# Patient Record
Sex: Female | Born: 1935 | Race: Black or African American | Hispanic: No | Smoking: Never smoker
Health system: Southern US, Community
[De-identification: ages and names within clinical notes are randomized; demographics above are authoritative.]

## PROBLEM LIST (undated history)

## (undated) DIAGNOSIS — F0390 Unspecified dementia without behavioral disturbance: Secondary | ICD-10-CM

## (undated) DIAGNOSIS — I749 Embolism and thrombosis of unspecified artery: Secondary | ICD-10-CM

## (undated) DIAGNOSIS — D649 Anemia, unspecified: Secondary | ICD-10-CM

## (undated) DIAGNOSIS — R4702 Dysphasia: Secondary | ICD-10-CM

## (undated) DIAGNOSIS — I8290 Acute embolism and thrombosis of unspecified vein: Secondary | ICD-10-CM

---

## 2006-12-11 ENCOUNTER — Encounter: Payer: Self-pay | Admitting: Cardiology

## 2006-12-11 ENCOUNTER — Ambulatory Visit: Payer: Self-pay | Admitting: Cardiology

## 2006-12-11 ENCOUNTER — Ambulatory Visit: Payer: Self-pay | Admitting: Internal Medicine

## 2006-12-11 ENCOUNTER — Inpatient Hospital Stay (HOSPITAL_COMMUNITY): Admission: EM | Admit: 2006-12-11 | Discharge: 2007-01-15 | Payer: Self-pay | Admitting: Cardiology

## 2007-08-28 ENCOUNTER — Inpatient Hospital Stay (HOSPITAL_COMMUNITY): Admission: EM | Admit: 2007-08-28 | Discharge: 2007-09-04 | Payer: Self-pay | Admitting: Emergency Medicine

## 2008-10-12 ENCOUNTER — Ambulatory Visit: Payer: Self-pay | Admitting: Vascular Surgery

## 2008-10-13 ENCOUNTER — Inpatient Hospital Stay (HOSPITAL_COMMUNITY): Admission: RE | Admit: 2008-10-13 | Discharge: 2008-10-17 | Payer: Self-pay | Admitting: Vascular Surgery

## 2008-10-13 ENCOUNTER — Encounter: Payer: Self-pay | Admitting: Vascular Surgery

## 2008-11-09 ENCOUNTER — Ambulatory Visit: Payer: Self-pay | Admitting: Vascular Surgery

## 2009-06-04 ENCOUNTER — Ambulatory Visit (HOSPITAL_COMMUNITY): Admission: RE | Admit: 2009-06-04 | Discharge: 2009-06-04 | Payer: Self-pay | Admitting: Internal Medicine

## 2010-02-10 ENCOUNTER — Encounter: Payer: Self-pay | Admitting: Internal Medicine

## 2010-04-26 LAB — CBC
HCT: 35.5 % — ABNORMAL LOW (ref 36.0–46.0)
Hemoglobin: 11.6 g/dL — ABNORMAL LOW (ref 12.0–15.0)
MCHC: 32.8 g/dL (ref 30.0–36.0)
RDW: 14.8 % (ref 11.5–15.5)

## 2010-04-26 LAB — BASIC METABOLIC PANEL
CO2: 22 mEq/L (ref 19–32)
Glucose, Bld: 167 mg/dL — ABNORMAL HIGH (ref 70–99)
Potassium: 3.9 mEq/L (ref 3.5–5.1)
Sodium: 147 mEq/L — ABNORMAL HIGH (ref 135–145)

## 2010-06-04 NOTE — Consult Note (Signed)
NAMEFREDDYE, CARDAMONE   ACCOUNT NO.:  [id number]   MEDICAL RECORD NO.:  [id number]          PATIENT TYPE:  INP   LOCATION:  1333                         FACILITY:  [facility]   PHYSICIAN:  Wilson Singer, M.D.DATE OF BIRTH:  [birth year]   DATE OF CONSULTATION:  09/01/2007  DATE OF DISCHARGE:                                 CONSULTATION   AGE:  77.   LOCATION:  1200.   REFERRING PHYSICIAN:  Nadear A. Arthor Captain, MD   DATE OF CONSULTATION:  August 31, 2007.   REASON FOR CONSULTATION:  To assist with goals of care and symptom  recommendations.   Consult performed by Arline Asp, nurse practitioner in  collaboration with Dr. Lilly Cove.   This NP reviewed the medical records, received report from the team,  assessed the patient, and then met with the patient's niece to discuss  goals, options, end-of-life wishes, etc.  Greater than 50% of this  consultation was spent in counseling and coordination of care from the  time of 11:30 a.m. to 1:20 p.m.   FAMILY WISHES/RECOMMENDATIONS:  1. DNR and comfort care.  2. No artificial feeds.  3. Transfer to the palliative care unit, where bed is available.  4. Comfort feeds understanding the risk of aspiration.  5. Back to the skilled nursing facility with palliative care versus      hospice to follow.   IMPRESSION:  Ongoing chronic failure to thrive in the phase of  progressive dementia, severe weight loss, dependent for all of the ADLs.  Acute diarrhea recently with colitis caused decreased intake and  increased water loss with resultant renal failure, which is now  resolving.  The patient also has a history of cyclical infections over  the last few months.  At the present time, the family goals are comfort  as they understand their aunt would not want artificial means of support  in the phase of declining condition and disease.  Disposition will be  dependent upon her intake.  The patient's niece is agreeable to a  skilled nursing facility as the patient came from [place] once the  patient is stable.  Support education reassurance given.  Please call  Arline Asp, nurse practitioner, at [phone] with any questions or  concerns.   [name] is an 77 year old African American female, who  came from [facility] with a past medical history  including COPD, dementia, coronary artery disease, dysphagia, and  malnutrition with failure to thrive and DNR status.  She was admitted to  the hospital on August 28, 2007 secondary to having increased diarrhea as  well as weakness and worsening creatinine on admission noted to be 6.5.  The patient was treated with fluid resuscitation and was seen by GI.  However, she was deemed to be a nonsurgical candidate.  I was asked to  meet with the patient's family today to discuss goals of care based on  her overall underlying dementia and failure to thrive.   PAST MEDICAL HISTORY:  As above in the HPI.   MEDICATIONS INCLUDE:  1. Aspirin 81 mg p.o. daily.  2. Depakote 125 mg p.o. daily.  3. Multivitamin 1 tablet p.o.  daily.  4. K-Dur 20 mEq p.o. daily.  5. Vitamin E 100 units p.o. daily.  6. Ventolin 2.5 mg inhaler q.6 h. p.r.n.  7. Robitussin 5 mL p.o. q.4 h. p.r.n.   ALLERGIES:  No known drug allergies.   SOCIAL HISTORY:  The family resides at [facility].  She has  a niece, who is named Biochemist, clinical, who is her Scientific laboratory technician and also  lives here in [place].  She is retired and was living at home until  several months ago when she was noted to have progressive dementia.  Since then, she has been declining per family, and family wishes that  she be a DNR.   FAMILY HISTORY:  Noncontributory.   REVIEW OF SYSTEMS:  The patient is unable to answer most questions.  However, she is able to answer yes/no.  She denies pain.  Denies  shortness of breath, refuses food, and appears comfortable per staff.  Staff do  report that she is refusing most food and pills.  They also  report that she is presently confused and agitated.  Her niece reports  that she has had progressive weight loss and functional decline over the  last few months with dependency for all ADLs.  They do report  significant direct diarrhea over the last few days.   PHYSICAL EXAMINATION:  GENERAL:  She is a frail, cachectic, African  American female, in no acute distress.  VITAL SIGNS:  Heart rate is 94, respirations 22, and O2 sat 98% on 2  liters.  HEENT:  Head is again with temporal wasting.  Mucous membranes dry.  She  has poor dentition.  NECK:  Supple without lymphadenopathy.  No thyromegaly.  No JVD.  HEART:  Heart rate is regular.  No murmurs, rubs, or gallops.  LUNGS:  Diminished; however, clear bilaterally.  ABDOMEN:  Soft.  Positive bowel sounds.  EXTREMITIES:  No edema with significant atrophy bilaterally.   LAB DATA:  Includes platelets 101, white blood cell count 12.5,  hemoglobin 9.1; sodium 149, BUN 23, creatinine 1.70, and calcium 7.5.      Asencion Noble, NP      ______________________________  Wilson Singer, M.D.    KMJ/MEDQ  D:  09/01/2007  T:  09/02/2007  Job:  [id number]   cc:   Michelene Gardener, MD   Hospice and Palliative Care, [facility]

## 2010-06-04 NOTE — H&P (Signed)
[name], [name] NO.:  [id number]   MEDICAL RECORD NO.:  [id number]          PATIENT TYPE:  EMS   LOCATION:  ED                           FACILITY:  [facility]   PHYSICIAN:  Herbie Saxon, MDDATE OF BIRTH:  Nov 15, 1922   DATE OF ADMISSION:  08/27/2007  DATE OF DISCHARGE:                              HISTORY & PHYSICAL   PRIMARY CARE PHYSICIAN:  Karlene Einstein, M.D.   This is a do not resuscitate, do not intubate.   PRESENTING COMPLAINT:  Loose bloody stools 3 days, weakness 3 days.   HISTORY OF PRESENTING COMPLAINT:  This 77 year old African American  female, nursing home resident at [facility], with severe dementia,  delirium, pericardial effusion historically, was transferred from the  nursing home because of weakness and rectal bleeding for the last 3  days.  The patient is unable to add any further history as she is  demented.  Currently agitated.   Past medical history and nursing home chart reviewed.  She has a history  of heavy alcohol use, and also the patient is a current tobacco smoker.  Past medical history also includes COPD, coronary artery disease,  dysphagia, malnutrition, hyperlipidemia.   SOCIAL HISTORY:  She lives in a nursing home.  Current smoker, heavy  drinker.   FAMILY HISTORY:  Not available.   ALLERGIES:  No known drug allergies.   MEDICATIONS:  1. Altace 2.5 daily.  2. Zocor 40 mg daily.  3. Aricept 10 mg daily.  4. Depakote 125 mg daily.  5. Multivitamins 1 tablet daily.  6. Lasix 20 mg daily.  7. Folbic 1 tablet daily.  8. Aspirin 325 mg daily.  9. Vitamin E 100 mg daily.  10.Potassium chloride 20 mEq daily.  11.Lopressor 12.5 mg b.i.d.   REVIEW OF SYSTEMS:  Not available.  The patient could not give history  owing to confusion.   PHYSICAL EXAMINATION:  GENERAL:  She is an elderly lady.  Restless,  demented.  VITAL SIGNS:  Temperature 98, pulse 75, respiratory rate 16, blood  pressure 103/61.  HEENT:   Blind, right eye.  Dehydrated.  Mucous membranes are dry.  Oropharynx and nasopharynx are clear.  NECK:  Supple.  CHEST:  Clinically clear.  HEART:  Heart sounds 1 and 2, regular rhythm.  ABDOMEN:  Scaphoid, soft, nontender.  No organomegaly palpable.  There  is a Foley catheter in place.  EXTREMITIES:  She is moving all limbs symmetrically.  Not obeying  commands appropriately.  Peripheral pulses present.  No pedal edema.  No  jaundice.  No erythema, edema or joint edema.   LABORATORY DATA:  The labs showed WBC 27, hematocrit 42, platelet count  177.  PT 24.7, INR 2.1, PTT 34.  She has a sodium of 153, potassium 6.8,  chloride 130, bicarbonate 13, BUN 140, creatinine 6.5, glucose 176.  Urinalysis:  WBCs 3 to 6, nitrite positive, leukocyte esterase moderate.   ASSESSMENT:  1. Lower gastrointestinal bleed.  2. Sepsis.  3. Urinary tract infection.  4. Acute renal failure with metabolic acidosis.  5. Hyperkalemia.  6. Hypernatremia.  7. Hyperchloremia.  8. dehydration.  9. Enterocolitis.  10.Coagulopathy, not on Coumadin.  11.Hyperglycemia, rule out new onset diabetes.  12.Leukocytosis.  13.Hypotension.   The patient is to be admitted to the intensive care unit.  Administer  vitamin K and FFP to correct the coagulopathy, type and cross-match 4  units of packed red blood cells and transfuse with 2 units packed red  blood cells if hemoglobin drops below 8.  , GI evaluation and renal  evaluation. Start on IV Cipro and IV fluids, after blood and urine  culture.  We will repeat CBC, BMP in the next 2 hours.  If repeat  greater than 5 mg,  insulin and sodium bicarb.  Hold p.o. meds until GI  evaluates.  We will obtain a renal ultrasound scan, urine sodium level,  check stool WBC, ova and parasites, TB tox screen, H and H, urinalysis  24 hours.  We will check PT/INR daily for the next 3 days.  Check for  coagulation parameters hematology evaluation.  Start normal saline at  125 mL an  hour for the next 24 hours , will be on bed rest strictly,  seizure, fall precaution.  We will seek a palliative care consult after  discussing with family member.  We will get cardiac enzymes q.8h. x3 and  repeat q.8h. x3.      Herbie Saxon, MD  Electronically Signed     MIO/MEDQ  D:  08/28/2007  T:  08/28/2007  Job:  [id number]   cc:   Karlene Einstein, M.D.  Fax: [phone]

## 2010-06-04 NOTE — Assessment & Plan Note (Signed)
OFFICE VISIT   [name], [name]  DOB:  1922/08/03                                       11/09/2008  CHART#:[id number]   I saw the patient in the office today for followup after her recent  right above the knee amputation.  This is an 77 year old markedly  debilitated patient with severe dementia who presented with gangrenous  changes of her right foot.  She had multilevel arterial occlusive  disease and was not a candidate for revascularization.  She also had  severe protein malnutrition.   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  The AKA has healed nicely and  her staples were removed in the office today.  The left foot is warm and  well-perfused with no ischemic ulcers and no heel sore.  I plan on  seeing her back in 6 months.  She knows to call sooner if she has  problems.   Di Kindle. Edilia Bo, M.D.  Electronically Signed   CSD/MEDQ  D:  11/09/2008  T:  11/10/2008  Job:  [id number]

## 2010-06-04 NOTE — Discharge Summary (Signed)
NAMEBERDINA, CHEEVER   ACCOUNT NO.:  [id number]   MEDICAL RECORD NO.:  [id number]          PATIENT TYPE:  INP   LOCATION:  3022                         FACILITY:  [facility]   PHYSICIAN:  Wilson Singer, M.D.DATE OF BIRTH:  Mar 22, 1922   DATE OF ADMISSION:  12/11/2006  DATE OF DISCHARGE:                               DISCHARGE SUMMARY   DATE OF DISCHARGE:  To be determined.   CURRENT DIAGNOSES:  1. Moderate to advanced dementia.  2. Non-ST elevation myocardial infarction versus Tako-Tsubo syndrome,      clinically stable.  3. Delirium, resolved.  4. Chronic obstructive pulmonary disease, stable.  5. Protein calorie malnutrition.  6. Dysphagia on a dysphagia-I diet.  7. Moderate echo free pericardial effusion without any cardiac      tamponade.   CONDITION AT PRESENT:  Stable.   PRESENT MEDICATIONS:  1. Altace 2.5 mg daily.  2. Zocor 40 mg daily.  3. Thiamine 100 mg daily.  4. Nicotine patch 21 mg daily.  5. Multivitamin one tablet daily.  6. Lasix 20 mg daily.  7. Foltx 1 tablet daily.  8. Aricept 5 mg q.h.s.  9. Depakote 250 mg daily.  10.Potassium chloride 20 mEq daily.  11.Aspirin 325 mg daily.  12.Colace 100 mg daily.  13.Protonix 40 mg daily.  14.Metoprolol 12.5 mg b.i.d.   HISTORY:  This 77 year old lady was admitted on December 11, 2006, where  she was noted to be confused from the behavioral health.  Please see  initial history and physical examination dictated by Dr. Hillery Aldo.  Please also see interim discharge summary by Dr. Corky Downs.  Please also  see consultations dictated by Dr. Antonietta Breach, Dr. Ellison Carwin  - neurology, and Dr. Valera Castle - cardiology.   HOSPITAL PROGRESS:  Since the interim discharge summary of Dr. Jamison Oka, there has really not been any significant change in the  patient's condition.  She remains slightly agitated due to her dementia.  She has no  other complaint such as shortness of breath or chest  pain.  She has been  seen by cardiology who have signed off and also by neurology.  She is on  a dysphagia-I diet, and she seems to be tolerating this.  She is now  awaiting placement to a skilled nursing facility pending Medicare issues  with the confusion of lack of Medicare number.      Wilson Singer, M.D.  Electronically Signed     NCG/MEDQ  D:  12/31/2006  T:  12/31/2006  Job:  [id number]

## 2010-06-04 NOTE — Procedures (Signed)
EEG NUMBER:  [id number].   HISTORY:  This is an 77 year old patient with a history of alcohol abuse  and confusion over the last month.  The patient is being evaluated for  the confusion.  This is a portable EEG recording.  No skull defects are  noted.   MEDICATIONS:  1. Aspirin.  2. Altace.  3. Folic acid.  4. Protonix.  5. Zocor.  6. Lopressor.  7. Ativan.   EEG classification essentially normal awake and drowsy.   DESCRIPTION OF THE RECORDING:  Background rhythm of this recording  consists of a moderately well-modulated medium amplitude alpha rhythm of  8 Hz that is reactive.  As the record progresses, the patient appears to  be a somewhat drowsy state with 7 Hz to 8 Hz of background slowing at  times with some rudimentary sleep spindles seen off and on.  The patient  does also demonstrate some generalized beta frequency activity  intermittently during the recording.  Photic stimulation is performed  resulting in a minimal bilateral photic driving response.  Hyperventilation was not done.  Towards the end of the recording, the  patient was alerted with 8 Hz of background activity seen and some head  movement and muscle movement artifact noted.  At no time during the  recording does there appear to be evidence of spike wave discharges or  evidence of focal slowing.  EKG monitor shows no evidence of cardiac  rhythm abnormalities with a heart rate of 90.   IMPRESSION:  This is an essentially normal EEG recording in the awake  and drowsy state.  No evidence of ictal or interictal discharges are  seen.  Overlying beta activity may be a medication effect such as is  seen with benzodiazepine such as Ativan.      Marlan Palau, M.D.  Electronically Signed     ZOX:WRUE  D:  12/14/2006 19:01:15  T:  12/15/2006 09:59:02  Job #:  [id number]

## 2010-06-04 NOTE — Op Note (Signed)
NAMEAVANI, SENSABAUGH NO.:  [id number]   MEDICAL RECORD NO.:  [id number]          PATIENT TYPE:  EMS   LOCATION:  ED                           FACILITY:  [facility]   PHYSICIAN:  Hillery Aldo, M.D.   DATE OF BIRTH:  09-09-1922   DATE OF PROCEDURE:  12/11/2006  DATE OF DISCHARGE:                               OPERATIVE REPORT   PRIMARY CARE PHYSICIAN:  None.   CHIEF COMPLAINT:  Altered mental status.   HISTORY OF PRESENT ILLNESS:  The patient is an 77 year old female who  was brought to the [facility] for an involuntary commitment  requested by the patient's niece.  The patient's niece is [name],  and she can be reached at [phone].  According to the patient's niece,  the patient was recently evicted from her resident secondary to  nonpayment of rent.  She has been delusional, agitated, violent and  exhibiting self-neglect.  She has had transients coming and going from  the house who are identified by the local police as drug users.  Upon  evaluation at the [facility], she was sent to the emergency  department for medical clearance and a routine EKG and laboratory  testing showed significant abnormalities.  We are asked to see her in  consultation by the ED physician.   PAST MEDICAL HISTORY:  Chronic obstructive pulmonary disease by  radiograph.  The patient and the patient's niece denies any ongoing  medical problems or prior history of surgery.   FAMILY HISTORY:  The patient states that her parents died of old age.  The patient's niece cannot provide any additional history.  She does  have a brother who died of cancer of the prostate gland as well as lung  cancer.   SOCIAL HISTORY:  The patient is widowed.  She currently lives alone but,  again, has been recently evicted from her apartment.  She has had  transients staying with her.  She is a heavy tobacco user and heavy  alcohol abuse there but does not specify the quantity.  She denies any  drug use.   ALLERGIES:  No known drug allergies.   MEDICATIONS:  None with the exception of an occasional aspirin.   REVIEW OF SYSTEMS:  The patient denies any fever or chills.  She denies  problems with her appetite.  She denies any weight loss or weight gain.  She denies any chest pain, shortness of breath, cough, changes in her  bowel habits, melena or hematochezia.  She has occasional indigestion.  Denies dysuria.   PHYSICAL EXAMINATION:  VITAL SIGNS:  Temperature 97.4, pulse 87,  respirations 20, blood pressure 153/67.  GENERAL:  This is a thin elderly female who is in no acute distress.  HEENT:  Normocephalic, atraumatic.  The left pupil is round and reactive  to light.  The patient will not open her right eye and she appears to  have some abnormality there.  She states that she uses eye drops but  cannot specify what she uses them for or type of eye drops she uses.  Oropharynx reveals poor dentition.  NECK:  Supple, no thyromegaly, lymphadenopathy, no jugular venous  distention.  CHEST:  Decreased breath sounds bilaterally.  HEART:  Regular rate, rhythm.  No murmurs, rubs or gallops.  ABDOMEN:  Soft, nontender, nondistended with normoactive bowel sounds.  EXTREMITIES:  No clubbing, edema, cyanosis.  SKIN:  Warm and dry.  No rashes.  NEUROLOGIC:  The patient is alert and oriented to month, day of the week  and year.  She moves all extremities x4 with equal strength.  Nonfocal  exam.   DATA REVIEW:  Chest x-ray shows cardiac enlargement, changes of COPD  been negative for acute disease.   A 12-lead EKG shows sinus rhythm with premature atrial contractions.  There are T-wave inversions in leads II, III, aVF, and V4-V6.   LABORATORY DATA:  Sodium is 144, potassium 2.7, chloride 103, bicarb 28,  BUN 90, creatinine 0.61, glucose 119.  White blood cell count of 7.7,  hemoglobin 13, hematocrit 37.8, platelets 243.  Urine drug screen is  negative.  Alcohol level was 8.   Myoglobin and CK-MBs are normal but  troponins are elevated at above 3 x3 sets.  Urinalysis shows too  numerous to count white blood cells, 7-10 red blood cells, and many  bacteria.  There is positive nitrites and a small amount of leukocyte  esterase.   ASSESSMENT AND PLAN:  1. Delirium:  Multifactorial, but suspect an acute cardiac event may      be contributory.  She will need a cardiology evaluation.  The      emergency department physician has already contacted cardiology and      they wish to transfer her to [facility] and      admit her.  Additionally, her urinary tract infection may be      contributing.  She has received a dose of Rocephin in the emergency      department and I would continue this for three days minimum.  If      further medical consultation is needed, please call and we will be      happy to see the patient in consultation with the cardiologist.      Hillery Aldo, M.D.  Electronically Signed     CR/MEDQ  D:  12/11/2006  T:  12/11/2006  Job:  [id number]
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
## 2010-06-04 NOTE — Consult Note (Signed)
[name], [name]   ACCOUNT NO.:  [id number]   MEDICAL RECORD NO.:  [id number]          PATIENT TYPE:  INP   LOCATION:  1326                         FACILITY:  [facility]   PHYSICIAN:  Wilson Singer, M.D.DATE OF BIRTH:  Aug 05, 1922   DATE OF CONSULTATION:  DATE OF DISCHARGE:  09/04/2007                                 CONSULTATION   AGE:  77.   LOCATION:  1200.   REFERRING PHYSICIAN:  Nadear A. Arthor Captain, MD   DATE OF CONSULTATION:  August 31, 2007.   REASON FOR CONSULTATION:  To assist with goals of care and symptom  recommendations.   Consult performed by Arline Asp, nurse practitioner in  collaboration with Dr. Lilly Cove.   This NP reviewed the medical records, received report from the team,  assessed the patient, and then met with the patient's niece to discuss  goals, options, end-of-life wishes, etc.  Greater than 50% of this  consultation was spent in counseling and coordination of care from the  time of 11:30 a.m. to 1:20 p.m.   FAMILY WISHES/RECOMMENDATIONS:  1. DNR and comfort care.  2. No artificial feeds.  3. Transfer to the palliative care unit, where bed is available.  4. Comfort feeds understanding the risk of aspiration.  5. Back to the skilled nursing facility with palliative care versus      hospice to follow.   IMPRESSION:  Ongoing chronic failure to thrive in the phase of  progressive dementia, severe weight loss, dependent for all of the ADLs.  Acute diarrhea recently with colitis caused decreased intake and  increased water loss with resultant renal failure, which is now  resolving.  The patient also has a history of cyclical infections over  the last few months.  At the present time, the family goals are comfort  as they understand their aunt would not want artificial means of support  in the phase of declining condition and disease.  Disposition will be  dependent upon her intake.  The patient's niece is agreeable to a  skilled nursing facility as the patient came from [place] once the  patient is stable.  Support education reassurance given.  Please call  Arline Asp, nurse practitioner, at [phone] with any questions or  concerns.   Ms. [name] is an 77 year old African American female, who  came from [facility] with a past medical history  including COPD, dementia, coronary artery disease, dysphagia, and  malnutrition with failure to thrive and DNR status.  She was admitted to  the hospital on August 28, 2007 secondary to having increased diarrhea as  well as weakness and worsening creatinine on admission noted to be 6.5.  The patient was treated with fluid resuscitation and was seen by GI.  However, she was deemed to be a nonsurgical candidate.  I was asked to  meet with the patient's family today to discuss goals of care based on  her overall underlying dementia and failure to thrive.   PAST MEDICAL HISTORY:  As above in the HPI.   MEDICATIONS INCLUDE:  1. Aspirin 81 mg p.o. daily.  2. Depakote 125 mg p.o. daily.  3. Multivitamin 1 tablet p.o.  daily.  4. K-Dur 20 mEq p.o. daily.  5. Vitamin E 100 units p.o. daily.  6. Ventolin 2.5 mg inhaler q.6 h. p.r.n.  7. Robitussin 5 mL p.o. q.4 h. p.r.n.   ALLERGIES:  No known drug allergies.   SOCIAL HISTORY:  The family resides at [facility].  She has  a niece, who is named Biochemist, clinical, who is her Scientific laboratory technician and also  lives here in [place].  She is retired and was living at home until  several months ago when she was noted to have progressive dementia.  Since then, she has been declining per family, and family wishes that  she be a DNR.   FAMILY HISTORY:  Noncontributory.   REVIEW OF SYSTEMS:  The patient is unable to answer most questions.  However, she is able to answer yes/no.  She denies pain.  Denies  shortness of breath, refuses food, and appears comfortable per staff.  Staff do  report that she is refusing most food and pills.  They also  report that she is presently confused and agitated.  Her niece reports  that she has had progressive weight loss and functional decline over the  last few months with dependency for all ADLs.  They do report  significant direct diarrhea over the last few days.   PHYSICAL EXAMINATION:  GENERAL:  She is a frail, cachectic, African  American female, in no acute distress.  VITAL SIGNS:  Heart rate is 94, respirations 22, and O2 sat 98% on 2  liters.  HEENT:  Head is again with temporal wasting.  Mucous membranes dry.  She  has poor dentition.  NECK:  Supple without lymphadenopathy.  No thyromegaly.  No JVD.  HEART:  Heart rate is regular.  No murmurs, rubs, or gallops.  LUNGS:  Diminished; however, clear bilaterally.  ABDOMEN:  Soft.  Positive bowel sounds.  EXTREMITIES:  No edema with significant atrophy bilaterally.   LAB DATA:  Includes platelets 101, white blood cell count 12.5,  hemoglobin 9.1; sodium 149, BUN 23, creatinine 1.70, and calcium 7.5.      Asencion Noble, NP      Wilson Singer, M.D.  Electronically Signed   KMJ/MEDQ  D:  09/01/2007  T:  11/01/2007  Job:  [id number]
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
## 2010-06-04 NOTE — Discharge Summary (Signed)
[name], STEGMANN   ACCOUNT NO.:  [id number]   MEDICAL RECORD NO.:  [id number]          PATIENT TYPE:  INP   LOCATION:  2004                         FACILITY:  [facility]   PHYSICIAN:  Kayla I Elsaid, MD      DATE OF BIRTH:  04-11-1922   DATE OF ADMISSION:  12/11/2006  DATE OF DISCHARGE:  12/27/2006                               DISCHARGE SUMMARY   DISCHARGE MEDICATIONS:  1. Aspirin 325 mg p.o. daily.  2. Depakote 250 mg p.o. daily.  3. Aricept 10 mg p.o. nightly.  4. Lasix 20 mg p.o. daily.  5. Metoprolol 12.5 mg p.o. q.12 h.  6. Multivitamin with iron 1 tab p.o. daily.  7. Nicotine patch 21 mcg transdermal daily for 3 weeks.  8. Protonix 40 mg p.o. daily.  9. Potassium chloride 20 mEq p.o. daily.  10.Altace 5 mg p.o. daily.  11.Zocor 40 mg p.o. nightly.  12.Thiamine 100 mg p.o. daily.  13.Foltx 1 tab p.o. daily.  14.Docusate 100 mg p.o. daily.   PROCEDURES:  Modified barium swallow during hospitalization.   HOSPITAL COURSE:  Patient had difficulty with swallowing.  Swallow and  speech evaluate the patient.  They recommended to keep the patient  n.p.o.  A repeat modified barium swallow in 3-5 days.  Multiple trials  to place band tube.  Patient removed it every time it was placed.  Accordingly the patient was started on IV fluids.  The patient had  another modified barium swallow done by swallow and speech where their  final recommendation was D1  with pudding.  Thick liquid and patient  will continue to have risk of aspiration even with the above diet based  on the result of the modified barium swallow and baseline Coumadin  change.  Also they recommend small bites and sips, a swallow checkup  with each bite and sip and clear throat intermittently.  I had a  detailed discussion with the niece which is the next of kin regarding  the above results and niece agrees to continue D1 diet.  Also the risk  of respiration is high.  Accordingly patient was started on the  above  diet.  Regarding patient's altered mental status, the patient remained  alert with fluctuations and confusion.  __________ was that Dr.  Jeanie Harmon  evaluate the patient for the agitation where he recommended  Depakote to be started at 250 mg p.o. daily.  Patient remained stable  during hospitalization.  The plan was for this patient to return to the  nursing home when bed available.      Kayla Bosie Helper, MD  Electronically Signed     HIE/MEDQ  D:  12/27/2006  T:  12/27/2006  Job:  [id number]
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
## 2010-06-04 NOTE — Discharge Summary (Signed)
[name], [name]   ACCOUNT NO.:  [id number]   MEDICAL RECORD NO.:  [id number]           PATIENT TYPE:   LOCATION:                                 FACILITY:   PHYSICIAN:  Mobolaji B. Bakare, M.D.DATE OF BIRTH:  1922-06-07   DATE OF ADMISSION:  DATE OF DISCHARGE:                               DISCHARGE SUMMARY   PRIMARY CARE PHYSICIAN:  Unassigned.   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  2. Acute systolic congestive heart failure  3. Delirium.  4. Alcohol abuse.  5. Tobacco abuse.  6. Dementia with agitation.  7. Severe protein calorie malnutrition.  8. Moderate oropharyngeal dysphagia.  9. Chronic obstructive pulmonary disease.  10.Moderate echo-free pericardial effusion.   RECOMMENDATION:  Follow-up 2-D echocardiogram in 4-6 weeks.   PROCEDURES:  1. Head CT scan done on December 11, 2006 showed no acute intracranial      abnormality.  2. Chest x-ray showed cardiomegaly with mild pulmonary vascular      congestion and COPD.  3. Ultrasound of the kidneys done on December 11, 2006 showed no      hydronephrosis, slightly echogenic kidneys.  4. MRI of the brain without and with contrast done on December 14, 2006 showed no acute infarct, age-related atrophy without      hydrocephalus, small vessel disease type changes.  5. Follow-up chest x-ray on December 13, 2006 showed worsening      pulmonary opacities, likely edema, bilateral pleural effusion and      cardiomegaly.  6. Panda tube insertion.  7. Swallowing evaluation on December 16, 2006.  8. Chest x-ray done on December 19, 2006 showed stable cardiomegaly      without edema, residual left lower lobe atelectasis/consolidation      and associated infusion.  9. Swallowing evaluation done on December 18, 2006.  10.A 2-D echocardiogram done on December 11, 2006 showed akinesis of      distal half of left ventricular apex, this raises the possibility   of Tako-Tsubo (apical ballooning syndrome), ejection fraction was      30-35%.  There was aortic valve thickness mildly to moderately      increased, mild aortic valve stenosis, with gradient of 7 mmHg,      valve area of 1.37 sq cm by VTI and 1.25 sq cm by Vmax, moderate      echo-free pericardial effusion circumferential to the __________      dimension of 8 mm anterior and 8 mm posterior.   CONSULTS:  1. Cardiology consult provided by Dr. Dietrich Pates.  2. Psychiatric consult provided by Dr. Jeanie Sewer.  3. Neurology consult provided by Dr. Sharene Skeans.   BRIEF HISTORY:  [name] is an 77 year old African-American female  who was admitted on December 11, 2026.  The patient was brought to the  emergency room from a mental health facility where she was noted to be  confused.  Apparently the patient is widowed.  She lives by herself.  She drinks alcohol.  She smokes cigarettes.  She has neglected herself.  The niece tells me that the patient has started wandering and getting  lost  in the last 1 week and she obtained commitment paper and she was  about to be committed involuntarily to a behavioral health facility when  they noticed confusion and the patient was sent to the emergency room  for medical care.  Initial evaluation in the emergency room showed  abnormal EKG with T-wave abnormalities and elevated troponin of 3.22, CK-  MB of 7.3, myoglobin of 104.  She was clearly dehydrated with indices  with specific gravity showed 1.032.  Urinalysis was positive  for  leukocyte esterase and many bacteria.  The patient was initially  admitted by cardiology and we consulted.  Subsequently the patient was  transferred to our service ([facility]).   She was evaluated with a head CT scan which was unremarkable in the  emergency room.  She was started on beta blocker, ACE inhibitor, and  Ativan withdrawal protocol.  She was admitted onto cardiac floor.  She  was also started on aspirin and  Rocephin for probable UTI.   1. Confusion/delirium:  The patient was confused, not oriented to      place and time at initial admission.  This was felt to be      multifactorial including alcohol abuse and infection.  Workup      included urine drug screen which was negative, TSH which was normal      at 3.235, ABG was unremarkable, pH of 7.47, pCO2 38, pO2 77 on room      air.  Vitamin B12 was 220.  Methylmalonic acid level is pending      (vitamin B12 is low normal).  RPR was nonreactive.  Ammonia was      slightly elevated at 36.  A follow-up ammonia was normal, 21.  The      patient had normal liver enzymes with AST 20 and ALT of 9.  It      should be mentioned that the patient's magnesium was 2.3 and      homocysteine level was slightly elevated at 16.9.  She was started      on foltx.  Red blood cell folate was 357.  The patients mental      status did not improve over the course of the next 24-48 hours.      Neurology was consulted.  Dr. Sharene Skeans was concerned about      meningismus on physical examination.  However, she does not fit the      picture of meningitis.  A spinal tap was done.  This was      essentially normal with negative cryptococcal antigen, CSF VDRL      negative, cell count within normal, glucose was normal, but protein      slightly elevated at 46.  Culture no growth to date.  She had an      EEG which was essentially normal in the awake and drowsy state.      This was done 4 days after admission.  There was no evidence of      seizure activity.  Overall, the etiology of the delirium is felt to      be multifactorial secondary to Tako-Tsubo syndrome, probable non-ST-      elevation MI, and alcohol related.  It should be noted that urine      culture came back as no growth.  Hence, antibiotic was      discontinued.  At this point delirium is resolving.  Upon further      review of information from the  patient's niece, she has been      exhibiting dementia  symptoms.  She is now unable to take care of      herself at home.  She has been wandering.  During the course of      hospitalization, the patient has been agitated and combative.  She      was seen in consultation by Dr. Jeanie Sewer.  He suggested Depakote      sprinkles.  This has been started.  Reversible causes of dementia      were evaluated.  It is noted that she has a low normal vitamin B12      at 220, methylmalonic acid level is pending.  If these suggest B12      deficiency, would start the patient on vitamin B12 supplement.  She      has been started on Aricept 5 mg daily.  2. 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      Echocardiogram showed ejection fraction of 30%.  The patient has      been started on beta blocker, ACE inhibitor, aspirin.  She is      tolerating all these medications.  There is no planned intervention      at this point, given the patient's other comorbidities.  Even if it      is non-ST elevation MI, the patient is not deemed to be a candidate      for intervention.  She will be medically treated.  She was noted on      initial chest x-ray to have pulmonary vascular congestion and CHF.      She was diuresed periodically as her blood pressure could tolerate.      Chest x-ray at the time of discharge showed no pulmonary edema.      However, BNP is elevated at 1208.  The patient has been placed on      low dose Lasix 20 mg daily.  The patient had a prolonged QTc      interval noted on EKG with QTc interval of 525.  Haldol was      discontinued due to this.  She has been on telemetry and there has      not been any malignant arrhythmia on tele.  QTc interval at the      time of admission was 531.  The patient has a moderate echo-free      pericardial effusion on 2-D echo.  This will require follow-up in  4-      6 weeks to ensure stability.  3. Severe oropharyngeal dysphagia:  During the initial course of      hospitalization while the patient was severely confused, she was      unable to take p.o. A feeding tube was placed.  When she became      more alert, she had a second speech evaluation which indicated      moderate oropharyngeal dysphagia.  A dysphagia-2 honey liquid was      recommended.  The patient is currently tolerating this.  4. Severe protein calorie malnutrition:  The patient is noted to have      low albumin and prealbumin was also low at 5.9.  The patient was      seen by nutritionist during the course of hospitalization.  5.  Alcohol abuse:  She was started on CIWA protocol.  She completed      this treatment protocol.  She is on thiamine, multivitamin, folic      acid.  6. Tobacco abuse:  The patient has significant history of tobacco use.      She was started on nicotine patch.  She is unable to understand and      comply with tobacco cessation counseling.   DISPOSITION:  This will likely be to skilled nursing facility for safety  purposes and 24-hour dependent care, particularly with medication use.  The patient's closest relative is a niece, Ms. Karma Ganja, telephone  number [phone].   Addendum will be made to this dictation at the time of discharge with  medications and discharge laboratory data.      Mobolaji B. Corky Downs, M.D.  Electronically Signed     MBB/MEDQ  D:  12/19/2006  T:  12/19/2006  Job:  [id number]

## 2010-06-04 NOTE — Discharge Summary (Signed)
NAMELURETHA, [name] NO.:  [id number]   MEDICAL RECORD NO.:  [id number]          PATIENT TYPE:  INP   LOCATION:  1326                         FACILITY:  [facility]   PHYSICIAN:  Isidor Holts, M.D.  DATE OF BIRTH:  May 13, 1922   DATE OF ADMISSION:  08/27/2007  DATE OF DISCHARGE:  09/04/2007                               DISCHARGE SUMMARY   PRIMARY MEDICAL DOCTOR:  Dr. Karlene Einstein.   DISCHARGE DIAGNOSES:  1. Acute colitis, likely ischemic in etiology.  2. Lower gastrointestinal bleed, secondary to #1 above.  3. Mild acute blood loss anemia, secondary to #s 1 and 2 above.  4. Urinary tract infection  5. Acute renal failure, secondary to volume depletion, dehydration and      ACE inhibitor.  6. Hypernatremia/hypomagnesemia.  7. Dementia.  8. History of coronary artery disease.  9. Chronic obstructive pulmonary disease.  10.Failure to thrive/dysphagia.  11.Do not resuscitate/do not intubate status.   DISCHARGE MEDICATIONS:  1. Depakote 125 mg p.o. daily.  2. Multivitamin 1 p.o. daily.  3. Vitamin E 100 international units p.o. daily.  4. K-Dur 20 mEq p.o. daily.  5. Analpram-HC 2.5% cream apply four times daily to hemorrhoids.  6. Aspirin 81 mg p.o. daily.  7. Flagyl 500 mg p.o. t.i.d. to be completed on 09/07/2007.  8. Magnesium oxide 400 mg p.o. daily to be completed on 09/07/2007.  9. Albuterol 2.5 mg/Atrovent 500 mcg bronchodilator nebulizers p.r.n.      q.4-6 hourly.  10.Tylenol 650 mg p.o. p.r.n. q.4 hourly.  11.Aricept 10 mg p.o. daily.  12.Zocor 40 mg p.o. nightly.   PROCEDURES:  1. Chest x-ray dated 08/28/2007, this showed COPD without acute      findings.  2. Repeat chest x-ray dated 08/28/2007, this showed PICC well-      positioned with tip 2 cm above the right atrium.  3. Renal ultrasound scan dated 08/28/2007, this showed no      hydronephrosis, no renal mass.   CONSULTATIONS:  1. Dr. Cecille Aver, nephrologist.  2. Dr.  Stan Head, gastroenterologist.  3. Dr. Wilson Singer, palliative care medicine.   ADMISSION HISTORY:  As in H&P notes of 08/27/2007, dictated by Dr.  [name].  However, in brief, this is an 77 year old female  nursing facility resident, with known history of severe dementia,  delirium, previous pericardial effusion, coronary artery disease, COPD,  dysphagia, malnutrition, dyslipidemia and hypertension, presenting with  progressive weakness and loose bloody stools for approximately 3 days  duration.  On initial evaluation, she was found to have a temperature of  98, pulse 75, respiratory 16, BP 103/61 mmHg, sodium 153, potassium 6.8,  BUN 140, creatinine 6.5.  Urinalysis showed WBC 7-10, RBC 11-20.  She  was admitted for further evaluation, investigation and management.   CLINICAL COURSE:  1. Urinary tract infection.  The patient as noted above, presented      with a positive urinary sediment consistent with urinary tract      infection.  She was treated with a 5-day course of Ciprofloxacin,      which was completed on 09/01/2007.  During the course of her  hospitalization, she showed no pyrexia, however, and had a steady      diminution of white cell count from 27.6 on 08/27/2007, to 12.1 on      09/03/2007.   1. Acute colitis.  The patient presented with lower GI bleed and loose      stools, raising the specter of ischemic colitis versus C. difficile      colitis.  GI consultation was called, and was kindly provided by      Dr. Charissa Bash opined that flexible sigmoidoscopy may be      needed in due course, but supported combination antibiotic therapy      with Ciprofloxacin and Flagyl.  The patient responded to these      management measures, and as it turned out endoscopic examination      was not required.  Diarrheal stools firmed up and overt      hematochezia resolved.  Stool samples for C. difficile toxin were      negative x2.  However, as of  09/03/2007, the patient was on day #6      of Flagyl.  A full 10-day course is to be completed on 09/07/2007.   1. Acute blood loss anemia.  This is secondary to #s 2 and 3 above.      The patient at the time of initial presentation, had a hemoglobin      of 13.9 against the background of profound volume depletion and      hypernatremia.  She was managed with intravenous fluid hydration,      with a subsequent downward trend in hemoglobin levels.  As of      09/03/2007, hemoglobin was 9.8, which was considered reasonable and      stable.   1. Dehydration/volume depletion/hypernatremia.  The patient presented      with above-mentioned symptoms of failure to thrive.  Initial      laboratory findings demonstrated a sodium of 153, potassium 6.8,      bicarbonate 13, BUN 140, creatinine 6.5.  She was managed with      intravenous fluid hydration and bicarbonate supplementation.  ACE      inhibitors and diuretics were held.  The patient responded to above-      mentioned management measures, and experienced a gradual      improvement in her renal indices.  Nephrology consultation was      kindly provided by Dr. Annie Sable.  For details of this      consultation, refer to consultation notes of 08/2007.  Renal      ultrasound scan showed no evidence of hydronephrosis.  As of      09/08/2007, BUN was 13, creatinine 1.38.  The patient subsequently      became hypokalemic and this was repleted appropriately.  Her sodium      levels have also improved and as of 09/02/2007, sodium was 146.   1. Acute renal failure.  This was secondary to poor oral intake,      against background of ACE inhibitor treatment and diuretics.      Diuretics and ACE inhibitor have been discontinued.  Per nephrology      recommendations, the patient is no longer in the future, to be      placed on ACE inhibitor or ARB.  Likewise, NSAIDs should be      avoided.  The patient did develop hypomagnesemia which was       addressed with appropriate supplementation.  1. Delirium.  The patient's mental status was altered at the time of      presentation.  This of course, was secondary to her acute medical      problems against background of severe dementia.  As of the date of      this dictation on 09/03/2007, mental status had returned to      baseline.   1. History of coronary artery disease.  There were no problems      referable to this, during the course of the patient's      hospitalization.   1. COPD.  The patient remained stable from this viewpoint.   1. Failure to thrive/dysphagia.  Per discussion with the patient's      family, no artificial means of feeding is desired.  The patient was      therefore managed with comfort feeds as tolerated.  Family is      prepared to accept the risk of aspiration.   DISPOSITION:  The patient was on 09/03/2007, considered sufficiently  recovered and clinically stable, to be discharged back to skilled  nursing facility with palliative care follow-up, and provided no acute  problems arise in the interim, she will be discharged on 09/04/2007.  The patient was seen in consultation by the palliative care team, i.e.,  Dr. Wilson Singer on a 09/01/2007.  He had a detailed discussion  with the patient's family about the patient's current medical status,  i.e., ongoing chronic failure to thrive in the presence of progressive  dementia, severe weight loss, dependent for all ADLs, against a  background of current and recent acute medical problems.  Per family's  wishes, the patient is to be DNR/DNI, comfort care, no artificial feeds.  The family understands the risk of aspiration.   DIET:  Heart-healthy as tolerated with aspiration precautions.   ACTIVITY:  As tolerated.   FOLLOW-UP INSTRUCTIONS:  The patient is to follow up routinely with her  primary MD, Dr. Karlene Einstein.      Isidor Holts, M.D.  Electronically Signed     CO/MEDQ  D:   09/03/2007  T:  09/03/2007  Job:  [id number]   cc:   Karlene Einstein, M.D.  Fax: [phone]

## 2010-06-04 NOTE — Consult Note (Signed)
NAMETYRENA, GOHR NO.:  [id number]   MEDICAL RECORD NO.:  [id number]          PATIENT TYPE:  INP   LOCATION:  1229                         FACILITY:  [facility]   PHYSICIAN:  Cecille Aver, M.D.DATE OF BIRTH:  29-Feb-1922   DATE OF CONSULTATION:  DATE OF DISCHARGE:                                 CONSULTATION   REQUESTING PHYSICIAN:  Dr. Tamsen Roers.   REASON FOR CONSULTATION:  Acute renal failure.   HISTORY OF PRESENT ILLNESS:  Ms. Stofer is an 77 year old  black female resident of [place] with past medical history to include  COPD, dementia, coronary artery disease, dysphagia/malnutrition issues  and she also carries a DNR status.  She was admitted early this a.m.  with history of lower GI bleed and weakness.  BUN and creatinine on  admission noted to be 140 and 6.5 (creatinine was 0.79 in December of  2008).  Patient also had hyperkalemia which now appears to be improved.  Patient had low blood pressure with systolic blood pressure in the 90s  and was noted to be on Altace and Kay-Ciel at the nursing home.  She has  been admitted and is being hydrated and offending medications have been  held.  Patient is alert but not oriented at this time.   PAST MEDICAL HISTORY:  1. Dementia/nursing home resident/no code blue.  2. Coronary artery disease/history of pericardial effusion.  3. Dysphagia/malnutrition.  4. COPD with history of tobacco.  5. History of alcohol abuse.   CURRENT MEDICATIONS:  1. Cipro 200 mg IV q.24 hours.  2. Flagyl 500 mg q.12 hours.  3. Depakote.  4. Protonix.  5. IV fluids at 125 an hour.   ALLERGIES:  NO KNOWN DRUG ALLERGIES.   SOCIAL HISTORY:  Patient resides at nursing home, she requires assistant  with all ADLs.   FAMILY HISTORY:  Unable to obtain secondary to patient's dementia.   REVIEW OF SYSTEMS:  Unable to obtain secondary to patient's dementia.   PHYSICAL EXAMINATION:  Patient is afebrile, blood pressure  114/98, heart  rate is 79, respirations 20, O2 sat is 95% on 2 liters.  She has had 30  mL of urine output since 0200.  GENERAL:  This is a thin, cachectic black female who is verbal but not  oriented at this time.  HEENT:  Pupils are equally round and reactive to light, extraocular  muscles are intact and mucous membranes are very dry.  There is also  skin tenting.  NECK:  There is no jugular venous distention.  LUNGS:  There are some chronic breath sounds consistent with COPD but no  fluid and no wheezes.  CARDIOVASCULAR:  Regular rate and rhythm.  ABDOMEN:  Flat, soft and nontender.  EXTREMITIES:  Reveal no edema.  Hands and feet are very cold to touch.   LABS:  Hemoglobin 12.6 which is down from 13.9, sodium 158, potassium  4.6, bicarb 14, BUN and creatinine 140 and 6.62 with a glucose of 136,  magnesium 3.6, phosphorus 7.6.   ASSESSMENT:  An 77 year old black female with acute renal failure in the  setting of lower gastrointestinal bleed/volume depletion/hypotension on  an angiotensin converting enzyme inhibitor and  Kay-Ciel repletion.  1. Renal:  Acute renal failure secondary to above.  I agree with      holding angiotensin converting enzyme and potassium and hydrating      fairly aggressively.  Hopefully this will lead to improvement in      renal function.  Patient is really not a candidate for chronic      dialysis therapy given age and dementia and other medical issues.  2. Hypernatremia secondary to volume depletion.  I would consider      Panda and free water boluses.  Patient is NPO right now but if it      is cleared I would consider this.   Thank you very much for this consultation, we will not see patient on  Sunday, August 9, but we will revisit on August 10.  Please call over  the weekend if additional questions.           ______________________________  Cecille Aver, M.D.     KAG/MEDQ  D:  08/28/2007  T:  08/28/2007  Job:  [id number]

## 2010-06-04 NOTE — Discharge Summary (Signed)
NAMEKARRI, KALLENBACH NO.:  [id number]   MEDICAL RECORD NO.:  [id number]          PATIENT TYPE:  INP   LOCATION:  3022                         FACILITY:  [facility]   PHYSICIAN:  Lonia Blood, M.D.       DATE OF BIRTH:  [birth year]   DATE OF ADMISSION:  12/11/2006  DATE OF DISCHARGE:  01/11/2007                               DISCHARGE SUMMARY   The patient's primary care physician will become the attending physician  at the skilled nursing facility where the patient is transferred.  For  complete list of Discharge Diagnoses and Discharge Medications, refer to  previously dictated Discharge Summary by Dr. Karilyn Cota. For complete list  of procedures, consultations, and Hospital Course, refer to previously  dictated discharge summaries done by Dr. Darnelle Catalan, Dr. Corky Downs, and Dr.  Karilyn Cota.   HOSPITAL COURSE:  This current dictation covers the Hospital Course from  January 01, 2007, until January 11, 2007.  During this period of time,  Mrs. Hastings has been stable.  Her dysphagia has improved  some.  She is able to tolerate a dysphagia 1 diet with thickened  liquids.  She did not show any signs of dehydration.  We have continued  the patient on her cardiac medications including a beta blocker, ACE  inhibitor.  She did not have any further cardiac events.  The patient  has been more cooperative, and no further agitation has been witnessed.  She is currently on some Depakote and Aricept, and this seems to be  controlling the situation fairly well.  She has remained fairly  disoriented, and she does have indeed advanced dementia.  A process of  guardianship has been started through the [facility].  Mrs. Yono is stable to transfer to a skilled nursing  facility, and further treatment to be dictated by her new primary care  physician at his discretion.  I think once guardianship is established  and a guardian is appointed by the court, addressing  the code status of  the patient would become quite important.      Lonia Blood, M.D.  Electronically Signed     SL/MEDQ  D:  01/11/2007  T:  01/11/2007  Job:  [id number]

## 2010-10-18 LAB — DIFFERENTIAL
Basophils Absolute: 0
Basophils Relative: 0
Basophils Relative: 0
Eosinophils Absolute: 0
Eosinophils Absolute: 0
Eosinophils Relative: 0
Lymphs Abs: 1
Lymphs Abs: 1.1
Monocytes Absolute: 0.6
Monocytes Absolute: 0.8
Monocytes Relative: 7
Neutrophils Relative %: 87 — ABNORMAL HIGH
Neutrophils Relative %: 93 — ABNORMAL HIGH

## 2010-10-18 LAB — RENAL FUNCTION PANEL
Albumin: 2.7 — ABNORMAL LOW
BUN: 110 — ABNORMAL HIGH
BUN: 73 — ABNORMAL HIGH
Calcium: 8 — ABNORMAL LOW
Chloride: 129 — ABNORMAL HIGH
Glucose, Bld: 107 — ABNORMAL HIGH
Glucose, Bld: 109 — ABNORMAL HIGH
Phosphorus: 2.9
Phosphorus: 4.3
Potassium: 3.5
Potassium: 3.7
Sodium: 158 — ABNORMAL HIGH

## 2010-10-18 LAB — GLUCOSE, CAPILLARY
Glucose-Capillary: 106 — ABNORMAL HIGH
Glucose-Capillary: 106 — ABNORMAL HIGH
Glucose-Capillary: 110 — ABNORMAL HIGH
Glucose-Capillary: 113 — ABNORMAL HIGH
Glucose-Capillary: 115 — ABNORMAL HIGH
Glucose-Capillary: 117 — ABNORMAL HIGH
Glucose-Capillary: 117 — ABNORMAL HIGH
Glucose-Capillary: 16 — CL
Glucose-Capillary: 21 — CL
Glucose-Capillary: 22 — CL
Glucose-Capillary: 26 — CL
Glucose-Capillary: 37 — CL
Glucose-Capillary: 54 — ABNORMAL LOW
Glucose-Capillary: 60 — ABNORMAL LOW
Glucose-Capillary: 92
Glucose-Capillary: 96
Glucose-Capillary: <10 — CL

## 2010-10-18 LAB — STOOL CULTURE

## 2010-10-18 LAB — CK TOTAL AND CKMB (NOT AT ARMC)
CK, MB: 10.2 — ABNORMAL HIGH
CK, MB: 8.2 — ABNORMAL HIGH
Relative Index: 1.6
Total CK: 472 — ABNORMAL HIGH
Total CK: 549 — ABNORMAL HIGH

## 2010-10-18 LAB — PROTIME-INR
INR: 1.6 — ABNORMAL HIGH
INR: 1.9 — ABNORMAL HIGH
INR: 2.1 — ABNORMAL HIGH
Prothrombin Time: 22.4 — ABNORMAL HIGH
Prothrombin Time: 24.7 — ABNORMAL HIGH

## 2010-10-18 LAB — BASIC METABOLIC PANEL
BUN: 109 — ABNORMAL HIGH
BUN: 35 — ABNORMAL HIGH
BUN: 74 — ABNORMAL HIGH
CO2: 13 — ABNORMAL LOW
CO2: 14 — ABNORMAL LOW
CO2: 31
Calcium: 10
Calcium: 7.5 — ABNORMAL LOW
Calcium: 7.9 — ABNORMAL LOW
Calcium: 8 — ABNORMAL LOW
Calcium: 8.5
Chloride: 112
Chloride: 121 — ABNORMAL HIGH
Creatinine, Ser: 1.38 — ABNORMAL HIGH
Creatinine, Ser: 1.7 — ABNORMAL HIGH
Creatinine, Ser: 1.91 — ABNORMAL HIGH
Creatinine, Ser: 3.21 — ABNORMAL HIGH
Creatinine, Ser: 4.69 — ABNORMAL HIGH
GFR calc Af Amer: 11 — ABNORMAL LOW
GFR calc Af Amer: 44 — ABNORMAL LOW
GFR calc Af Amer: 7 — ABNORMAL LOW
GFR calc non Af Amer: 14 — ABNORMAL LOW
GFR calc non Af Amer: 25 — ABNORMAL LOW
GFR calc non Af Amer: 37 — ABNORMAL LOW
GFR calc non Af Amer: 9 — ABNORMAL LOW
Glucose, Bld: 112 — ABNORMAL HIGH
Glucose, Bld: 115 — ABNORMAL HIGH
Glucose, Bld: 89
Sodium: 149 — ABNORMAL HIGH
Sodium: 158 — ABNORMAL HIGH

## 2010-10-18 LAB — MAGNESIUM: Magnesium: 1.3 — ABNORMAL LOW

## 2010-10-18 LAB — CBC
HCT: 30.9 — ABNORMAL LOW
HCT: 32.5 — ABNORMAL LOW
Hemoglobin: 11.1 — ABNORMAL LOW
Hemoglobin: 12.6
Hemoglobin: 13.9
Hemoglobin: 9.8 — ABNORMAL LOW
MCHC: 31.5
MCHC: 32.6
MCV: 88.4
MCV: 90.5
Platelets: 125 — ABNORMAL LOW
Platelets: 82 — ABNORMAL LOW
Platelets: 91 — ABNORMAL LOW
RBC: 3.03 — ABNORMAL LOW
RBC: 3.22 — ABNORMAL LOW
RBC: 3.35 — ABNORMAL LOW
RBC: 3.63 — ABNORMAL LOW
RBC: 4.33
RDW: 14.2
RDW: 15.4
WBC: 11.7 — ABNORMAL HIGH
WBC: 12.1 — ABNORMAL HIGH
WBC: 12.5 — ABNORMAL HIGH
WBC: 13.5 — ABNORMAL HIGH

## 2010-10-18 LAB — HEPATIC FUNCTION PANEL
ALT: 27
AST: 34
Albumin: 3.9
Alkaline Phosphatase: 56
Indirect Bilirubin: 0.6
Total Protein: 7.2

## 2010-10-18 LAB — VALPROIC ACID LEVEL: Valproic Acid Lvl: <10 — ABNORMAL LOW

## 2010-10-18 LAB — OVA AND PARASITE EXAMINATION

## 2010-10-18 LAB — TYPE AND SCREEN

## 2010-10-18 LAB — OCCULT BLOOD X 1 CARD TO LAB, STOOL: Fecal Occult Bld: POSITIVE

## 2010-10-18 LAB — URINE MICROSCOPIC-ADD ON

## 2010-10-18 LAB — HEMOGLOBIN AND HEMATOCRIT, BLOOD
HCT: 27.9 — ABNORMAL LOW
HCT: 31.8 — ABNORMAL LOW
Hemoglobin: 10.5 — ABNORMAL LOW

## 2010-10-18 LAB — CULTURE, BLOOD (ROUTINE X 2)
Culture: NO GROWTH
Culture: NO GROWTH

## 2010-10-18 LAB — CLOSTRIDIUM DIFFICILE EIA
C difficile Toxins A+B, EIA: NEGATIVE
C difficile Toxins A+B, EIA: NEGATIVE

## 2010-10-18 LAB — URINALYSIS, ROUTINE W REFLEX MICROSCOPIC
Bilirubin Urine: NEGATIVE
Ketones, ur: NEGATIVE
Nitrite: NEGATIVE
Protein, ur: >300 — AB
Specific Gravity, Urine: 1.016
Urobilinogen, UA: 0.2
Urobilinogen, UA: 0.2
pH: 5

## 2010-10-18 LAB — URINE CULTURE
Colony Count: NO GROWTH
Colony Count: NO GROWTH

## 2010-10-18 LAB — POCT I-STAT, CHEM 8
BUN: >140 — ABNORMAL HIGH
Calcium, Ion: 1.21
Glucose, Bld: 176 — ABNORMAL HIGH
TCO2: 13

## 2010-10-18 LAB — APTT
aPTT: 34
aPTT: 44 — ABNORMAL HIGH

## 2010-10-18 LAB — PHOSPHORUS: Phosphorus: 2.2 — ABNORMAL LOW

## 2010-10-18 LAB — TROPONIN I: Troponin I: 0.02

## 2010-10-25 LAB — BASIC METABOLIC PANEL
BUN: 18
CO2: 26
Chloride: 98
GFR calc Af Amer: >60
GFR calc non Af Amer: >60
Glucose, Bld: 110 — ABNORMAL HIGH
Glucose, Bld: 87
Potassium: 4.1
Potassium: 4.3
Sodium: 134 — ABNORMAL LOW
Sodium: 137

## 2010-10-25 LAB — HEMOGLOBIN A1C: Mean Plasma Glucose: 122

## 2010-10-28 LAB — CBC
HCT: 35.1 — ABNORMAL LOW
HCT: 37.8
Hemoglobin: 11.6 — ABNORMAL LOW
Hemoglobin: 12.7
MCHC: 33.5
MCV: 91.2
MCV: 93
RBC: 3.75 — ABNORMAL LOW
RBC: 3.77 — ABNORMAL LOW
RBC: 3.8 — ABNORMAL LOW
RBC: 4.14
RDW: 15.8 — ABNORMAL HIGH
WBC: 5.8
WBC: 6.6
WBC: 7.3

## 2010-10-28 LAB — BASIC METABOLIC PANEL
BUN: 12
BUN: 9
CO2: 24
CO2: 24
Calcium: 8.8
Calcium: 9
Calcium: 9.1
Chloride: 106
Chloride: 107
Chloride: 107
Creatinine, Ser: 0.53
Creatinine, Ser: 0.54
Creatinine, Ser: 0.55
GFR calc Af Amer: >60
GFR calc Af Amer: >60
GFR calc Af Amer: >60
GFR calc Af Amer: >60
GFR calc non Af Amer: >60
GFR calc non Af Amer: >60
GFR calc non Af Amer: >60
Potassium: 3.3 — ABNORMAL LOW
Potassium: 3.6
Potassium: 3.7
Sodium: 138
Sodium: 139
Sodium: 141

## 2010-10-28 LAB — COMPREHENSIVE METABOLIC PANEL
AST: 24
Alkaline Phosphatase: 61
BUN: 11
CO2: 27
Chloride: 100
Creatinine, Ser: 0.82
GFR calc non Af Amer: >60
Total Bilirubin: 0.7

## 2010-10-28 LAB — B-NATRIURETIC PEPTIDE (CONVERTED LAB)
Pro B Natriuretic peptide (BNP): 588 — ABNORMAL HIGH
Pro B Natriuretic peptide (BNP): 781 — ABNORMAL HIGH

## 2010-10-28 LAB — CLOSTRIDIUM DIFFICILE EIA

## 2010-10-28 LAB — FECAL LACTOFERRIN, QUANT

## 2010-10-29 LAB — COMPREHENSIVE METABOLIC PANEL
ALT: 9
AST: 20
Albumin: 3.1 — ABNORMAL LOW
Alkaline Phosphatase: 67
BUN: 11
CO2: 29
Calcium: 8.1 — ABNORMAL LOW
Chloride: 99
Creatinine, Ser: 0.55
GFR calc Af Amer: >60
GFR calc Af Amer: >60
GFR calc non Af Amer: >60
Glucose, Bld: 92
Potassium: 3.9
Sodium: 138
Sodium: 141
Total Protein: 6.1
Total Protein: 6.6

## 2010-10-29 LAB — DIFFERENTIAL
Basophils Absolute: 0
Eosinophils Relative: 0
Lymphocytes Relative: 26
Lymphs Abs: 2
Neutro Abs: 5
Neutrophils Relative %: 66

## 2010-10-29 LAB — BASIC METABOLIC PANEL
BUN: 6
BUN: 7
BUN: 7
BUN: 9
CO2: 21
CO2: 24
CO2: 25
CO2: 26
Calcium: 8.2 — ABNORMAL LOW
Calcium: 8.5
Calcium: 9.3
Chloride: 103
Chloride: 103
Chloride: 106
Chloride: 95 — ABNORMAL LOW
Creatinine, Ser: 0.61
Creatinine, Ser: 0.61
Creatinine, Ser: 0.82
GFR calc Af Amer: >60
GFR calc Af Amer: >60
GFR calc Af Amer: >60
GFR calc non Af Amer: >60
GFR calc non Af Amer: >60
GFR calc non Af Amer: >60
GFR calc non Af Amer: >60
GFR calc non Af Amer: >60
GFR calc non Af Amer: >60
GFR calc non Af Amer: >60
Glucose, Bld: 110 — ABNORMAL HIGH
Glucose, Bld: 119 — ABNORMAL HIGH
Glucose, Bld: 123 — ABNORMAL HIGH
Glucose, Bld: 124 — ABNORMAL HIGH
Glucose, Bld: 137 — ABNORMAL HIGH
Glucose, Bld: 145 — ABNORMAL HIGH
Glucose, Bld: 97
Potassium: 2.7 — CL
Potassium: 2.9 — ABNORMAL LOW
Potassium: 3 — ABNORMAL LOW
Potassium: 3.2 — ABNORMAL LOW
Potassium: 3.5
Potassium: 3.6
Potassium: 3.8
Potassium: 3.9
Potassium: 4.2
Potassium: 4.4
Sodium: 135
Sodium: 136
Sodium: 138
Sodium: 138
Sodium: 140

## 2010-10-29 LAB — CBC
HCT: 33 — ABNORMAL LOW
HCT: 35.8 — ABNORMAL LOW
HCT: 42.3
Hemoglobin: 11.1 — ABNORMAL LOW
Hemoglobin: 12
MCHC: 32.9
MCHC: 33.5
MCV: 93.1
MCV: 93.7
Platelets: 201
Platelets: 218
Platelets: 227
Platelets: 243
RDW: 17.5 — ABNORMAL HIGH
RDW: 17.6 — ABNORMAL HIGH
RDW: 17.8 — ABNORMAL HIGH
RDW: 18 — ABNORMAL HIGH
WBC: 5.9
WBC: 7.7
WBC: 9.2

## 2010-10-29 LAB — URINE CULTURE
Colony Count: NO GROWTH
Culture: NO GROWTH

## 2010-10-29 LAB — PROTIME-INR: Prothrombin Time: 15.1

## 2010-10-29 LAB — BLOOD GAS, ARTERIAL
Acid-Base Excess: 4.4 — ABNORMAL HIGH
Acid-base deficit: 4.4 — ABNORMAL HIGH
Acid-base deficit: 6.4 — ABNORMAL HIGH
Bicarbonate: 19.6 — ABNORMAL LOW
Drawn by: 28459
Drawn by: 287641
FIO2: 1
O2 Content: 3
O2 Saturation: 91.6
O2 Saturation: 93.5
O2 Saturation: 96
Patient temperature: 97.9
Patient temperature: 98.6
TCO2: 20.7
pCO2 arterial: 33.2 — ABNORMAL LOW
pO2, Arterial: 73.2 — ABNORMAL LOW
pO2, Arterial: 75.2 — ABNORMAL LOW

## 2010-10-29 LAB — CK TOTAL AND CKMB (NOT AT ARMC)
CK, MB: 5.2 — ABNORMAL HIGH
CK, MB: 6 — ABNORMAL HIGH
Relative Index: 4.9 — ABNORMAL HIGH
Total CK: 107
Total CK: 119

## 2010-10-29 LAB — CSF CULTURE W GRAM STAIN: Culture: NO GROWTH

## 2010-10-29 LAB — AMMONIA: Ammonia: 36 — ABNORMAL HIGH

## 2010-10-29 LAB — URINALYSIS, ROUTINE W REFLEX MICROSCOPIC
Glucose, UA: NEGATIVE
Protein, ur: 100 — AB
pH: 5.5

## 2010-10-29 LAB — RAPID URINE DRUG SCREEN, HOSP PERFORMED
Barbiturates: NOT DETECTED
Benzodiazepines: NOT DETECTED

## 2010-10-29 LAB — CSF CELL COUNT WITH DIFFERENTIAL
RBC Count, CSF: 19 — ABNORMAL HIGH
Tube #: 3
WBC, CSF: 1

## 2010-10-29 LAB — URINE MICROSCOPIC-ADD ON

## 2010-10-29 LAB — CULTURE, BLOOD (ROUTINE X 2): Culture: NO GROWTH

## 2010-10-29 LAB — TROPONIN I: Troponin I: 0.44 — ABNORMAL HIGH

## 2010-10-29 LAB — B-NATRIURETIC PEPTIDE (CONVERTED LAB)
Pro B Natriuretic peptide (BNP): 1372 — ABNORMAL HIGH
Pro B Natriuretic peptide (BNP): 593 — ABNORMAL HIGH

## 2010-10-29 LAB — CARDIAC PANEL(CRET KIN+CKTOT+MB+TROPI)
Relative Index: 2.9 — ABNORMAL HIGH
Relative Index: INVALID
Total CK: 77
Troponin I: 0.26 — ABNORMAL HIGH

## 2010-10-29 LAB — ETHANOL: Alcohol, Ethyl (B): 8

## 2010-10-29 LAB — PROTEIN AND GLUCOSE, CSF
Glucose, CSF: 56
Total  Protein, CSF: 46 — ABNORMAL HIGH

## 2010-10-29 LAB — POCT CARDIAC MARKERS
Myoglobin, poc: 104
Operator id: 1192
Troponin i, poc: 3.22

## 2010-10-29 LAB — MAGNESIUM: Magnesium: 2

## 2010-10-29 LAB — TSH: TSH: 3.235

## 2010-10-29 LAB — GRAM STAIN

## 2010-10-29 LAB — FOLATE RBC: RBC Folate: 357

## 2010-10-29 LAB — LIPID PANEL
Cholesterol: 137
HDL: 57

## 2010-10-29 LAB — HOMOCYSTEINE: Homocysteine: 16.9 — ABNORMAL HIGH

## 2012-04-20 DIAGNOSIS — D509 Iron deficiency anemia, unspecified: Secondary | ICD-10-CM

## 2012-05-07 DIAGNOSIS — L89309 Pressure ulcer of unspecified buttock, unspecified stage: Secondary | ICD-10-CM

## 2012-05-07 DIAGNOSIS — F0281 Dementia in other diseases classified elsewhere with behavioral disturbance: Secondary | ICD-10-CM

## 2012-05-07 DIAGNOSIS — F0280 Dementia in other diseases classified elsewhere without behavioral disturbance: Secondary | ICD-10-CM

## 2012-05-07 DIAGNOSIS — G309 Alzheimer's disease, unspecified: Secondary | ICD-10-CM

## 2012-05-07 DIAGNOSIS — L8992 Pressure ulcer of unspecified site, stage 2: Secondary | ICD-10-CM

## 2012-05-13 ENCOUNTER — Other Ambulatory Visit: Payer: Self-pay | Admitting: Vascular Surgery

## 2012-09-08 ENCOUNTER — Emergency Department (HOSPITAL_COMMUNITY): Payer: Medicare Other

## 2012-09-08 ENCOUNTER — Inpatient Hospital Stay (HOSPITAL_COMMUNITY)
Admission: EM | Admit: 2012-09-08 | Discharge: 2012-09-13 | DRG: 086 | Payer: Medicare Other | Attending: Internal Medicine | Admitting: Internal Medicine

## 2012-09-08 ENCOUNTER — Encounter (HOSPITAL_COMMUNITY): Payer: Self-pay | Admitting: Emergency Medicine

## 2012-09-08 DIAGNOSIS — I6200 Nontraumatic subdural hemorrhage, unspecified: Secondary | ICD-10-CM

## 2012-09-08 DIAGNOSIS — J449 Chronic obstructive pulmonary disease, unspecified: Secondary | ICD-10-CM | POA: Diagnosis present

## 2012-09-08 DIAGNOSIS — Z86718 Personal history of other venous thrombosis and embolism: Secondary | ICD-10-CM

## 2012-09-08 DIAGNOSIS — S069X9A Unspecified intracranial injury with loss of consciousness of unspecified duration, initial encounter: Secondary | ICD-10-CM

## 2012-09-08 DIAGNOSIS — R4789 Other speech disturbances: Secondary | ICD-10-CM | POA: Diagnosis present

## 2012-09-08 DIAGNOSIS — Z681 Body mass index (BMI) 19 or less, adult: Secondary | ICD-10-CM

## 2012-09-08 DIAGNOSIS — I4891 Unspecified atrial fibrillation: Secondary | ICD-10-CM | POA: Diagnosis present

## 2012-09-08 DIAGNOSIS — S069X0A Unspecified intracranial injury without loss of consciousness, initial encounter: Secondary | ICD-10-CM

## 2012-09-08 DIAGNOSIS — R64 Cachexia: Secondary | ICD-10-CM | POA: Diagnosis present

## 2012-09-08 DIAGNOSIS — S066XAA Traumatic subarachnoid hemorrhage with loss of consciousness status unknown, initial encounter: Secondary | ICD-10-CM

## 2012-09-08 DIAGNOSIS — S065XAA Traumatic subdural hemorrhage with loss of consciousness status unknown, initial encounter: Principal | ICD-10-CM

## 2012-09-08 DIAGNOSIS — E870 Hyperosmolality and hypernatremia: Secondary | ICD-10-CM | POA: Diagnosis present

## 2012-09-08 DIAGNOSIS — F0390 Unspecified dementia without behavioral disturbance: Secondary | ICD-10-CM | POA: Diagnosis present

## 2012-09-08 DIAGNOSIS — W19XXXA Unspecified fall, initial encounter: Secondary | ICD-10-CM | POA: Diagnosis present

## 2012-09-08 DIAGNOSIS — J4489 Other specified chronic obstructive pulmonary disease: Secondary | ICD-10-CM | POA: Diagnosis present

## 2012-09-08 DIAGNOSIS — S066X9A Traumatic subarachnoid hemorrhage with loss of consciousness of unspecified duration, initial encounter: Secondary | ICD-10-CM

## 2012-09-08 DIAGNOSIS — S065X9A Traumatic subdural hemorrhage with loss of consciousness of unspecified duration, initial encounter: Secondary | ICD-10-CM

## 2012-09-08 DIAGNOSIS — E876 Hypokalemia: Secondary | ICD-10-CM | POA: Diagnosis present

## 2012-09-08 DIAGNOSIS — D72829 Elevated white blood cell count, unspecified: Secondary | ICD-10-CM | POA: Diagnosis present

## 2012-09-08 HISTORY — DX: Acute embolism and thrombosis of unspecified vein: I82.90

## 2012-09-08 HISTORY — DX: Unspecified dementia, unspecified severity, without behavioral disturbance, psychotic disturbance, mood disturbance, and anxiety: F03.90

## 2012-09-08 HISTORY — DX: Anemia, unspecified: D64.9

## 2012-09-08 HISTORY — DX: Embolism and thrombosis of unspecified artery: I74.9

## 2012-09-08 HISTORY — DX: Dysphasia: R47.02

## 2012-09-08 LAB — CBC WITH DIFFERENTIAL/PLATELET
Basophils Relative: 0 % (ref 0–1)
HCT: 42.3 % (ref 36.0–46.0)
Hemoglobin: 13.1 g/dL (ref 12.0–15.0)
Lymphs Abs: 1.4 10*3/uL (ref 0.7–4.0)
MCH: 26.3 pg (ref 26.0–34.0)
MCHC: 31 g/dL (ref 30.0–36.0)
Monocytes Absolute: 0.5 10*3/uL (ref 0.1–1.0)
Monocytes Relative: 3 % (ref 3–12)
Neutro Abs: 15.5 10*3/uL — ABNORMAL HIGH (ref 1.7–7.7)
RBC: 4.98 MIL/uL (ref 3.87–5.11)

## 2012-09-08 LAB — COMPREHENSIVE METABOLIC PANEL
Albumin: 3.3 g/dL — ABNORMAL LOW (ref 3.5–5.2)
Alkaline Phosphatase: 96 U/L (ref 39–117)
BUN: 65 mg/dL — ABNORMAL HIGH (ref 6–23)
Chloride: 125 mEq/L — ABNORMAL HIGH (ref 96–112)
Creatinine, Ser: 1.4 mg/dL — ABNORMAL HIGH (ref 0.50–1.10)
GFR calc Af Amer: 38 mL/min — ABNORMAL LOW (ref >90)
GFR calc non Af Amer: 33 mL/min — ABNORMAL LOW (ref >90)
Glucose, Bld: 159 mg/dL — ABNORMAL HIGH (ref 70–99)
Total Bilirubin: 0.4 mg/dL (ref 0.3–1.2)

## 2012-09-08 LAB — URINALYSIS, ROUTINE W REFLEX MICROSCOPIC
Nitrite: NEGATIVE
Specific Gravity, Urine: 1.032 — ABNORMAL HIGH (ref 1.005–1.030)
Urobilinogen, UA: 1 mg/dL (ref 0.0–1.0)

## 2012-09-08 LAB — PROTIME-INR
INR: 1.27 (ref 0.00–1.49)
Prothrombin Time: 15.6 seconds — ABNORMAL HIGH (ref 11.6–15.2)

## 2012-09-08 LAB — URINE MICROSCOPIC-ADD ON

## 2012-09-08 MED ORDER — SODIUM CHLORIDE 0.9 % IV SOLN
Freq: Once | INTRAVENOUS | Status: AC
  Administered 2012-09-08: 23:00:00 via INTRAVENOUS

## 2012-09-08 MED ORDER — INSULIN ASPART 100 UNIT/ML ~~LOC~~ SOLN
1.0000 [IU] | SUBCUTANEOUS | Status: DC
  Administered 2012-09-09: 1 [IU] via SUBCUTANEOUS

## 2012-09-08 MED ORDER — SODIUM CHLORIDE 0.9 % IV SOLN: 250.0000 mL | INTRAVENOUS | Status: DC | PRN

## 2012-09-08 MED ORDER — DEXTROSE 5 % IV SOLN
INTRAVENOUS | Status: DC
  Administered 2012-09-08 – 2012-09-10 (×3): via INTRAVENOUS

## 2012-09-08 MED ORDER — PANTOPRAZOLE SODIUM 40 MG IV SOLR
40.0000 mg | Freq: Every day | INTRAVENOUS | Status: DC
  Administered 2012-09-09 (×2): 40 mg via INTRAVENOUS
  Filled 2012-09-08 (×4): qty 40

## 2012-09-08 NOTE — ED Provider Notes (Addendum)
CSN: [id number]     Arrival date & time 09/08/12  1849 History     First MD Initiated Contact with Patient 09/08/12 1900     Chief Complaint  Patient presents with  . Altered Mental Status    HPI  Patient came from her extended care facility. No history is obtainable from her. Family is not available initially. Per the staff there as related to the paramedics she had a fall this morning. She seemed "fine". This afternoon she was less responsive. By this they describe her as talking less. She arrives here awake eyes open but demented. No additional history is obtainable  Past Medical History  Diagnosis Date  . Dementia   . Dysphasia   . Anemia   . Embolism   . Thrombosis    History reviewed. No pertinent past surgical history. History reviewed. No pertinent family history. History  Substance Use Topics  . Smoking status: Not on file  . Smokeless tobacco: Not on file  . Alcohol Use: Not on file   OB History   Grav Para Term Preterm Abortions TAB SAB Ect Mult Living                 Review of Systems  Unable to perform ROS   Allergies  Review of patient's allergies indicates no known allergies.  Home Medications   Current Outpatient Rx  Name  Route  Sig  Dispense  Refill  . acetaminophen (TYLENOL) 500 MG tablet   Oral   Take 500 mg by mouth every 6 (six) hours as needed for pain.         Marland Kitchen aspirin 81 MG tablet   Oral   Take 81 mg by mouth daily.         Marland Kitchen guaiFENesin (ROBITUSSIN) 100 MG/5ML liquid   Oral   Take 200 mg by mouth 3 (three) times daily as needed for cough.         . iron polysaccharides (NIFEREX) 150 MG capsule   Oral   Take 150 mg by mouth daily.         Marland Kitchen loperamide (IMODIUM A-D) 2 MG tablet   Oral   Take 2 mg by mouth 4 (four) times daily as needed for diarrhea or loose stools.         Marland Kitchen LORazepam (ATIVAN) 0.5 MG tablet   Oral   Take 0.5 mg by mouth every 8 (eight) hours as needed for anxiety.         . magnesium hydroxide  (MILK OF MAGNESIA) 400 MG/5ML suspension   Oral   Take 30 mLs by mouth daily as needed for constipation or heartburn.         . mirtazapine (REMERON) 15 MG tablet   Oral   Take 15 mg by mouth at bedtime.         . Multiple Vitamin (MULTIVITAMIN WITH MINERALS) TABS tablet   Oral   Take 1 tablet by mouth daily.         Marland Kitchen PRESCRIPTION MEDICATION   Oral   Take 1 Can by mouth 4 (four) times daily. Mighty Shakes (4 oz)         . sennosides-docusate sodium (SENOKOT-S) 8.6-50 MG tablet   Oral   Take 1 tablet by mouth at bedtime.         . Vitamin D, Ergocalciferol, (DRISDOL) 50000 UNITS CAPS capsule   Oral   Take 50,000 Units by mouth See admin instructions. Take 1 capsule every  month on the 17th          BP 131/83  Pulse 92  Temp(Src) 97 F (36.1 C) (Oral)  Resp 23  SpO2 96% Physical Exam  Constitutional: She appears cachectic. She is uncooperative.  Eyes:  Are symmetric and reactive at 3 mm  Neck:  Response to palpation on the neck she is moving her neck freely upon her arrival and is not  Cardiovascular: Normal rate and regular rhythm.  Exam reveals distant heart sounds. Exam reveals no S3, no S4 and no friction rub.   Pulmonary/Chest: She has no decreased breath sounds. She has no wheezes. She has no rhonchi. She has no rales.  Has a wet sounding cough. Is not pooling secretions in the pharynx. Good gag reflex. Clear symmetric breath sounds. Well oxygenated on room air  Neurological:  Likely her eyes are open she is awake. She speaks in one or 2 words at a time. Her speech is nonsensical. She moves all 4 extremities symmetrically. No concern  about her level of consciousness.   Membranes are dry. ED Course   Procedures (including critical care time)  Labs Reviewed  URINALYSIS, ROUTINE W REFLEX MICROSCOPIC - Abnormal; Notable for the following:    Color, Urine AMBER (*)    APPearance CLOUDY (*)    Specific Gravity, Urine 1.032 (*)    Bilirubin Urine  MODERATE (*)    Protein, ur 30 (*)    All other components within normal limits  CBC WITH DIFFERENTIAL - Abnormal; Notable for the following:    WBC 17.4 (*)    RDW 16.9 (*)    Neutrophils Relative % 89 (*)    Neutro Abs 15.5 (*)    Lymphocytes Relative 8 (*)    All other components within normal limits  COMPREHENSIVE METABOLIC PANEL - Abnormal; Notable for the following:    Sodium 164 (*)    Chloride 125 (*)    Glucose, Bld 159 (*)    BUN 65 (*)    Creatinine, Ser 1.40 (*)    Total Protein 8.9 (*)    Albumin 3.3 (*)    AST 61 (*)    ALT 55 (*)    GFR calc non Af Amer 33 (*)    GFR calc Af Amer 38 (*)    All other components within normal limits  PROTIME-INR - Abnormal; Notable for the following:    Prothrombin Time 15.6 (*)    All other components within normal limits  URINE MICROSCOPIC-ADD ON - Abnormal; Notable for the following:    Squamous Epithelial / LPF FEW (*)    Bacteria, UA FEW (*)    All other components within normal limits  URINE CULTURE   Ct Head Wo Contrast  09/08/2012   *RADIOLOGY REPORT*  Clinical Data: Headache.  Vertigo.  CT HEAD WITHOUT CONTRAST  Technique:  Contiguous axial images were obtained from the base of the skull through the vertex without contrast.  Comparison: CT scan dated 12/11/2006  Findings: There is a small hemorrhagic contusion of the right frontal lobe adjacent to the sylvian fissure visible on images 13 and 14 of series 2.  There is also a small subdural hematoma along the anterior aspect of the interhemispheric falx as well as inferior to the frontal lobe in the midline.  There is a small amount of subarachnoid hemorrhage in the left subfrontal region as well as a tiny left subdural hematoma over the left frontal lobe seen on image number 13 of series  2.  There is diffuse chronic cerebral cortical and cerebellar atrophy. The ventricles are not dilated.  The patient does have air fluid levels in both maxillary sinuses with extensive partial  opacification of the frontal sinus consistent with acute sinusitis.  There is extensive cerumen and both external auditory canals. Mastoid air cells are clear.  No skull fractures.  Chronic calcification of a portion of the lobe of the right eye.  IMPRESSION:  The patient has acute intracranial hemorrhages including subarachnoid, subdural, and parenchymal, consistent with a fall.  There is no mass effect or midline shift.  The hemorrhages are small.  Probable acute maxillary and frontal sinusitis.   Original Report Authenticated By: Francene Boyers, M.D.   Ct Cervical Spine Wo Contrast  09/08/2012   *RADIOLOGY REPORT*  Clinical Data: Dementia.  Mental status changes.  Intracranial hemorrhage appear  CT CERVICAL SPINE WITHOUT CONTRAST  Technique:  Multidetector CT imaging of the cervical spine was performed. Multiplanar CT image reconstructions were also generated.  Comparison: None  Findings: No fracture is seen in the cervical region.  There is degenerative arthritis of the C1-2 articulation without encroachment upon the neural spaces.  C2-3 shows mild spondylosis and facet arthropathy without significant stenosis.  There is fusion in the region from C3-T1 with anterior bridging osteophytes. This could indicate diffuse idiopathic skeletal hyperostosis. There are endplate osteophytes at C4-5 and C6-7 without evidence of critical spinal stenosis.  IMPRESSION: No acute or traumatic finding.  Chronic fusion of the spine from C3- T1.  Mild degenerative changes at the upper cervical region.   Original Report Authenticated By: Paulina Fusi, M.D.   Dg Chest Port 1 View  09/08/2012   *RADIOLOGY REPORT*  Clinical Data: Altered mental status  PORTABLE CHEST - 1 VIEW  Comparison: 08/28/2007  Findings: Borderline cardiomegaly.  No acute infiltrate or pleural effusion.  No pulmonary edema.  Atherosclerotic calcifications of thoracic aorta again noted.  IMPRESSION: No active disease.  Borderline cardiomegaly.   Original Report  Authenticated By: Natasha Mead, M.D.   1. Subdural hematoma   2. Hypernatremia     MDM  Discussion this is an altered female with a history of dementia with a little available history. After several hours in the emergency room is able to speak with this can't does have her power of attorney. Is able to confirm that the patient is full code. I discussed at length the possibility of making her DO NOT RESUSCITATE based on her age and presentation. A scant was adamant he wanted her to be "alive as long as she can be "I initially discussed the case with trauma services. I discussed this with Dr. Yetta Barre of neurosurgery. And then with Dr. Aundria Rud of critical care. I appreciate you put all the above physicians. Ultimately the decision was made to transfer the patient at [facility]. She be admitted to the neuro ICU. She'll be reevaluated by neurosurgery. Her sodium will be corrected. S. patient's power of attorney to bring her report to the hospital.  Power of attorney can be reached at phone number [phone].  Tamela Oddi Gant).  Claudean Kinds, MD 09/08/12 2231  Claudean Kinds, MD 09/08/12 2232

## 2012-09-08 NOTE — ED Notes (Signed)
Bed: WA04 Expected date:  Expected time:  Means of arrival:  Comments: ems 

## 2012-09-08 NOTE — Progress Notes (Signed)
Patient ID: [name], female   DOB: 07-09-1922, 79 y.o.   MRN: [id number] I was called to review a CT scan of the head on this patient who is an elderly, demented pt who fel at the nursing home. She is dehydrated with increased Na and Cl and BUN/ Cr. She also has a leukocytosis (UTI? Demargination?). Her head CT showed scattered areas of intracranial blood and I was called. She is likely at her baseline; awake, garbled speech.  Her head CT shows chronic atrophy with no ventriculomegaly, tiny areas of frontal tSAH, no mass effect or shift, basal cisterns open.  This is no indication for surgical intervention. The areas of concern are unimpressive and very likely clinically insignificant. She can be admitted for observation and rehydration and possible causes of leukocytosis. She likely would not benefit from repeat head CT imaging unless there is some change in mental status. Even in the event of neurologic deterioration, I would be very hesitant to offer aggressive surgical intervention given her premorbid function and endstage dementia.  I am certainly available for questions should they arise.

## 2012-09-08 NOTE — Progress Notes (Signed)
eLink Physician-Brief Progress Note Patient Name: [name] DOB: 1922-05-18 MRN: [id number]  Date of Service  09/08/2012   HPI/Events of Note   Call from ED at [facility] to admit in transfer patient [facility] to neuro ICu at [facility].  Plans in place for transfer  eICU Interventions  tfr to [facility] , [facility]m will see pt at [facility] in neuro icu Will need formal neurosurgical consultation once at [facility]   Intervention Category Major Interventions: Change in mental status - evaluation and management  Shan Levans 09/08/2012, 10:22 PM

## 2012-09-08 NOTE — ED Notes (Signed)
[facility] in house to transport pt to [facility].  

## 2012-09-08 NOTE — H&P (Addendum)
PULMONARY  / CRITICAL CARE MEDICINE  Name: [name] MRN: [id number] DOB: 28-Apr-1922    ADMISSION DATE:  09/08/2012 CONSULTATION DATE:  09/08/2012  REFERRING MD :  Fayrene Fearing EDP PRIMARY SERVICE: PCCM  CHIEF COMPLAINT:  Altered mental status  BRIEF PATIENT DESCRIPTION: 79 y/o female was brought from a nursing home was admitted on 09/08/2012 for SDH and hypernatremia.  SIGNIFICANT EVENTS / STUDIES:  8/20 CT head > acute intracranial hemorrhages including subarachnoid, subdural and parenchymal bleeding; no mass effect or midline shift, small hemorrhages; probable maxillary and frontal sinusitis 8/20 CT neck > no acute or traumatic finding  LINES / TUBES:   CULTURES: 8/20 blood >> 8/20 urine >>  ANTIBIOTICS:   HISTORY OF PRESENT ILLNESS:  79 y/o female was brought from a nursing home was admitted on 09/08/2012 for SDH and hypernatremia. Apparently she fell at her nursing home on 8/20 and was noted to be confused later so she was sent to the ED.  No staff members from the nursing home were available to discuss her care and her HCPOA has been out of town and was unaware of any recent illnesses or change in her status.  History is obtained from chart review only.  PAST MEDICAL HISTORY :  Past Medical History  Diagnosis Date  . Dementia   . Dysphasia   . Anemia   . Embolism   . Thrombosis    History reviewed. No pertinent past surgical history. Prior to Admission medications   Medication Sig Start Date End Date Taking? Authorizing Provider  acetaminophen (TYLENOL) 500 MG tablet Take 500 mg by mouth every 6 (six) hours as needed for pain.   Yes Historical Provider, MD  aspirin 81 MG tablet Take 81 mg by mouth daily.   Yes Historical Provider, MD  guaiFENesin (ROBITUSSIN) 100 MG/5ML liquid Take 200 mg by mouth 3 (three) times daily as needed for cough.   Yes Historical Provider, MD  iron polysaccharides (NIFEREX) 150 MG capsule Take 150 mg by mouth daily.   Yes Historical  Provider, MD  loperamide (IMODIUM A-D) 2 MG tablet Take 2 mg by mouth 4 (four) times daily as needed for diarrhea or loose stools.   Yes Historical Provider, MD  LORazepam (ATIVAN) 0.5 MG tablet Take 0.5 mg by mouth every 8 (eight) hours as needed for anxiety.   Yes Historical Provider, MD  magnesium hydroxide (MILK OF MAGNESIA) 400 MG/5ML suspension Take 30 mLs by mouth daily as needed for constipation or heartburn.   Yes Historical Provider, MD  mirtazapine (REMERON) 15 MG tablet Take 15 mg by mouth at bedtime.   Yes Historical Provider, MD  Multiple Vitamin (MULTIVITAMIN WITH MINERALS) TABS tablet Take 1 tablet by mouth daily.   Yes Historical Provider, MD  PRESCRIPTION MEDICATION Take 1 Can by mouth 4 (four) times daily. Mighty Shakes (4 oz)   Yes Historical Provider, MD  sennosides-docusate sodium (SENOKOT-S) 8.6-50 MG tablet Take 1 tablet by mouth at bedtime.   Yes Historical Provider, MD  Vitamin D, Ergocalciferol, (DRISDOL) 50000 UNITS CAPS capsule Take 50,000 Units by mouth See admin instructions. Take 1 capsule every month on the 17th   Yes Historical Provider, MD   No Known Allergies  FAMILY HISTORY/SOCIAL HISTORY/REVIEW OF SYSTEMS:  Cannot obtain due to confusion  SUBJECTIVE:   VITAL SIGNS: Temp:  [97 F (36.1 C)-97.7 F (36.5 C)] 97 F (36.1 C) (08/20 2231) Pulse Rate:  [92-110] 92 (08/20 2227) Resp:  [12-26] 23 (08/20 2227) BP: (131-166)/(83-145) 131/83 mmHg (08/20  2227) SpO2:  [96 %-100 %] 96 % (08/20 2227) HEMODYNAMICS:   VENTILATOR SETTINGS:   INTAKE / OUTPUT: Intake/Output   None     PHYSICAL EXAMINATION:  Gen: chronically ill appearing, cachectic HEENT: NCAT, R eye ptosis, L eye pupil round, reactive to ligh PULM: CTA B CV: Tachy, irregular rhythm, no mgr, no JVD AB: BS+, soft, nontender, no hsm Ext: warm, no edema, no clubbing, no cyanosis Derm: no rash or skin breakdown Neuro: Awake, screams occasionally, follows simple commands (open your mouth, raise  your hand), 1-2 word answers, moves all four extremities but not cooperative with full neuro exam   LABS:  CBC Recent Labs     09/08/12  1945  WBC  17.4*  HGB  13.1  HCT  42.3  PLT  258   Coag's Recent Labs     09/08/12  2115  INR  1.27   BMET Recent Labs     09/08/12  1945  NA  164*  K  4.2  CL  125*  CO2  20  BUN  65*  CREATININE  1.40*  GLUCOSE  159*   Electrolytes Recent Labs     09/08/12  1945  CALCIUM  9.8   Sepsis Markers No results found for this basename: LACTICACIDVEN, PROCALCITON, O2SATVEN,  in the last 72 hours ABG No results found for this basename: PHART, PCO2ART, PO2ART,  in the last 72 hours Liver Enzymes Recent Labs     09/08/12  1945  AST  61*  ALT  55*  ALKPHOS  96  BILITOT  0.4  ALBUMIN  3.3*   Cardiac Enzymes No results found for this basename: TROPONINI, PROBNP,  in the last 72 hours Glucose No results found for this basename: GLUCAP,  in the last 72 hours   CXR: emphysema, no infiltrate  ASSESSMENT / PLAN:  NEUROLOGIC A:  Acute traumatic intracerebral/subarachnoid/subdural hemorrhage apparently from a fall; uncertain why the fall occurred; per NSGY, no intervention needed Baseline dementia, eats but doesn't walk, not capable of normal conversation P:   -request formal NSGY evaluation/consult on 8/21 AM -Given that mental status is at baseline, no indication for Cerebral pressure monitoring -keep SBP > 90 -hold remeron  PULMONARY A: No acute issues P:   -monitor respiratory status  CARDIOVASCULAR A: Irregular heart rhythm, paroxysmal afib? P:  -12 lead now -tele -hold asa/anticoagulation -consider metoprolol  RENAL A:  Hypernatremia > presumably due to dehydration, consider DI vs spurious; if dehydration, not clear why this is going on P:   -stat repeat BMET -if Na still elevated, start D5W at 50cc/hr to correct Na at estimated 3meQ/24 hrs -q6h BMET -urine osm  GASTROINTESTINAL A:  No acute issues P:    -advance diet as tolerated in AM  HEMATOLOGIC A:  Intracranial hemorrhage P:  -no antiplatelet/anticoagulants  INFECTIOUS A:  Leukocytosis but no obvious signs of infection P:   -blood/urine cultures -hold antibiotics for now  ENDOCRINE A:  No acute issues P:   -monitor glucose  Code status: No CPR, short term intubation OK; discussed with niece, HCPOA 74 [address];   TODAY'S SUMMARY:   I have personally obtained a history, examined the patient, evaluated laboratory and imaging results, formulated the assessment and plan and placed orders. CRITICAL CARE: The patient is critically ill with multiple organ systems failure and requires high complexity decision making for assessment and support, frequent evaluation and titration of therapies, application of advanced monitoring technologies and extensive interpretation of multiple databases. Critical Care  Time devoted to patient care services described in this note is 45 minutes.   Fonnie Jarvis Pulmonary and Critical Care Medicine [facility] Pager: [phone]  09/08/2012, 11:40 PM

## 2012-09-08 NOTE — ED Notes (Signed)
Patient transported to CT 

## 2012-09-08 NOTE — ED Notes (Signed)
PER EMS- pt picked up from [facility] with c/o "pt not talking as much as usual".  Staff reports they are unsure of how long symptoms have been going on, tech reports that pt has been nonverbal since 3p.  Staff also reports pt is SOB and weak.  Pt denies any complaints, alert and oriented per baseline.  Pt has hx of dementia.  Staff also reports pt fell this morning and didn't send pt out because she had no injuries; however, afternoon staff became concerned.

## 2012-09-09 ENCOUNTER — Inpatient Hospital Stay (HOSPITAL_COMMUNITY): Payer: Medicare Other

## 2012-09-09 DIAGNOSIS — E870 Hyperosmolality and hypernatremia: Secondary | ICD-10-CM | POA: Diagnosis present

## 2012-09-09 LAB — BASIC METABOLIC PANEL
BUN: 65 mg/dL — ABNORMAL HIGH (ref 6–23)
BUN: 47 mg/dL — ABNORMAL HIGH (ref 6–23)
CO2: 24 mEq/L (ref 19–32)
Chloride: 130 mEq/L — ABNORMAL HIGH (ref 96–112)
Chloride: 126 mEq/L — ABNORMAL HIGH (ref 96–112)
Creatinine, Ser: 1.3 mg/dL — ABNORMAL HIGH (ref 0.50–1.10)
Creatinine, Ser: 1.09 mg/dL (ref 0.50–1.10)
GFR calc Af Amer: 42 mL/min — ABNORMAL LOW (ref >90)
GFR calc Af Amer: 68 mL/min — ABNORMAL LOW (ref >90)
GFR calc non Af Amer: 36 mL/min — ABNORMAL LOW (ref >90)
Glucose, Bld: 132 mg/dL — ABNORMAL HIGH (ref 70–99)
Potassium: 4.2 mEq/L (ref 3.5–5.1)
Potassium: 4 mEq/L (ref 3.5–5.1)

## 2012-09-09 LAB — CBC
MCV: 84.2 fL (ref 78.0–100.0)
Platelets: 246 10*3/uL (ref 150–400)
RBC: 4.55 MIL/uL (ref 3.87–5.11)
RDW: 17 % — ABNORMAL HIGH (ref 11.5–15.5)
WBC: 18.4 10*3/uL — ABNORMAL HIGH (ref 4.0–10.5)

## 2012-09-09 LAB — URINE CULTURE: Colony Count: NO GROWTH

## 2012-09-09 LAB — GLUCOSE, CAPILLARY
Glucose-Capillary: 105 mg/dL — ABNORMAL HIGH (ref 70–99)
Glucose-Capillary: 107 mg/dL — ABNORMAL HIGH (ref 70–99)
Glucose-Capillary: 117 mg/dL — ABNORMAL HIGH (ref 70–99)
Glucose-Capillary: 133 mg/dL — ABNORMAL HIGH (ref 70–99)
Glucose-Capillary: 95 mg/dL (ref 70–99)

## 2012-09-09 LAB — MRSA PCR SCREENING: MRSA by PCR: NEGATIVE

## 2012-09-09 LAB — NA AND K (SODIUM & POTASSIUM), RAND UR: Potassium Urine: 60 mEq/L

## 2012-09-09 NOTE — Progress Notes (Signed)
PULMONARY  / CRITICAL CARE MEDICINE  Name: [name] MRN: [id number] DOB: [birth year]    ADMISSION DATE:  09/08/2012 CONSULTATION DATE:  09/08/2012  REFERRING MD :  Fayrene Fearing EDP PRIMARY SERVICE: PCCM  CHIEF COMPLAINT:  Altered mental status  BRIEF PATIENT DESCRIPTION: 79 y/o female with advanced dementia was brought from a nursing home was admitted on 09/08/2012 for SDH and hypernatremia.  SIGNIFICANT EVENTS / STUDIES:  8/20 CT head > acute intracranial hemorrhages including subarachnoid, subdural and parenchymal bleeding; no mass effect or midline shift, small hemorrhages; probable maxillary and frontal sinusitis 8/20 CT neck > no acute or traumatic finding  LINES / TUBES:   CULTURES: 8/20 blood >> 8/20 urine >>  ANTIBIOTICS:  SUBJECTIVE: afebrile, denies pain  VITAL SIGNS: Temp:  [97 F (36.1 C)-97.9 F (36.6 C)] 97.9 F (36.6 C) (08/21 0800) Pulse Rate:  [74-110] 78 (08/21 0800) Resp:  [12-27] 21 (08/21 1000) BP: (112-166)/(57-145) 128/57 mmHg (08/21 1000) SpO2:  [94 %-100 %] 100 % (08/21 1000) Weight:  [39.2 kg (86 lb 6.7 oz)-39.7 kg (87 lb 8.4 oz)] 39.7 kg (87 lb 8.4 oz) (08/21 0500) HEMODYNAMICS:   VENTILATOR SETTINGS:   INTAKE / OUTPUT: Intake/Output     08/20 0701 - 08/21 0700 08/21 0701 - 08/22 0700   I.V. (mL/kg) 480 (12.1) 150 (3.8)   Total Intake(mL/kg) 480 (12.1) 150 (3.8)   Net +480 +150        Urine Occurrence 1 x 1 x     PHYSICAL EXAMINATION:  Gen: chronically ill appearing, cachectic HEENT: NCAT, Rt enucleated, L eye pupil round, reactive to light, dry mucosa PULM: CTA B CV: Tachy, irregular rhythm, no mgr, no JVD AB: BS+, soft, nontender, no hsm Ext: warm, no edema, no clubbing, no cyanosis Derm: no rash or skin breakdown Neuro: Awake,  follows simple commands (open your mouth, raise your hand), 1-2 word answers, moves all four extremities    LABS:  CBC Recent Labs     09/08/12  1945  09/09/12  0200  WBC  17.4*  18.4*   HGB  13.1  12.6  HCT  42.3  38.3  PLT  258  246   Coag's Recent Labs     09/08/12  2115  INR  1.27   BMET Recent Labs     09/08/12  1945  09/09/12  0100  NA  164*  166*  K  4.2  4.2  CL  125*  129*  CO2  20  25  BUN  65*  65*  CREATININE  1.40*  1.30*  GLUCOSE  159*  136*   Electrolytes Recent Labs     09/08/12  1945  09/09/12  0100  CALCIUM  9.8  9.6   Sepsis Markers No results found for this basename: LACTICACIDVEN, PROCALCITON, O2SATVEN,  in the last 72 hours ABG No results found for this basename: PHART, PCO2ART, PO2ART,  in the last 72 hours Liver Enzymes Recent Labs     09/08/12  1945  AST  61*  ALT  55*  ALKPHOS  96  BILITOT  0.4  ALBUMIN  3.3*   Cardiac Enzymes No results found for this basename: TROPONINI, PROBNP,  in the last 72 hours Glucose Recent Labs     09/09/12  0027  09/09/12  0416  GLUCAP  107*  95     CXR: emphysema, no infiltrate  ASSESSMENT / PLAN:  NEUROLOGIC A:  Acute traumatic intracerebral/subarachnoid/subdural hemorrhage apparently from a fall; uncertain why the fall  occurred; per NSGY, no intervention needed Baseline dementia, eats but doesn't walk, not capable of normal conversation P:   -No NSGY intervention even if worsens- appreciate input -hold remeron  PULMONARY A: No acute issues P:   -monitor  CARDIOVASCULAR A: Irregular heart rhythm, paroxysmal afib? P:  -tele -hold asa/anticoagulation -consider metoprolol  RENAL A:  Hypernatremia > presumably due to dehydration, consider DI vs spurious; if dehydration,likely due to poor PO intake - P:   -increase  D5W at 1000cc/hr & rechk -q6h BMET -urine osm  GASTROINTESTINAL A:  No acute issues P:   -advance diet as tolerated in AM  HEMATOLOGIC A:  Intracranial hemorrhage P:  -no antiplatelet/anticoagulants  INFECTIOUS A:  Leukocytosis but no obvious signs of infection P:   -blood/urine cultures -hold antibiotics for now  ENDOCRINE A:  No  acute issues P:   -monitor glucose  Code status: No CPR, short term intubation OK; discussed with niece, HCPOA 57 [place]   TODAY'S SUMMARY: Can transfer to SDU & to [facility] in am  I have personally obtained a history, examined the patient, evaluated laboratory and imaging results, formulated the assessment and plan and placed orders. CRITICAL CARE: The patient is critically ill with multiple organ systems failure and requires high complexity decision making for assessment and support, frequent evaluation and titration of therapies, application of advanced monitoring technologies and extensive interpretation of multiple databases. Critical Care Time devoted to patient care services described in this note is 31 minutes.   [facility] V.,MD Pulmonary and Critical Care Medicine [facility] Pager: [phone]  09/09/2012, 10:20 AM

## 2012-09-09 NOTE — Evaluation (Signed)
Clinical/Bedside Swallow Evaluation Patient Details  Name: [name] MRN: [id number] Date of Birth: 1922/02/11  Today's Date: 09/09/2012 Time: 3474-2595 SLP Time Calculation (min): 32 min  Past Medical History:  Past Medical History  Diagnosis Date  . Dementia   . Dysphasia   . Anemia   . Embolism   . Thrombosis    Past Surgical History: History reviewed. No pertinent past surgical history. HPI:  79 y/o female with advanced dementia was brought from a nursing home was admitted on 09/08/2012 for SDH and hypernatremia. Pt with a h/o dysphagia, and a bedside swallowing evaluation was ordered. Per RN report, niece shared that pt has been consuming a Dys. 1 diet with no mention of liquid consistency.   Assessment / Plan / Recommendation Clinical Impression  Pt presents with positive s/s of possible penetration/aspiration with thin and nectar-thick liquids. Signs of dysphagia include oral holding, decreased awareness of bolus, decreased posterior transit with mild pocketing on the left, and suspected delayed swallow with decreased hyolaryngeal movement. Pt did not present with overt s/s of aspiration with pureed solids and honey-thick liquids. Therefore, recommend to initiate a trial diet of Dys. 1 textures and honey-thick liquids with full supervision for tolerance and use of strategies. Recommend to follow closely for possible need for objective testing.    Aspiration Risk  Moderate    Diet Recommendation Dysphagia 1 (Puree);Honey-thick liquid   Liquid Administration via: Cup;No straw Medication Administration: Crushed with puree Supervision: Patient able to self feed;Full supervision/cueing for compensatory strategies Compensations: Slow rate;Small sips/bites;Check for pocketing;Check for anterior loss (May need cues to swallow) Postural Changes and/or Swallow Maneuvers: Seated upright 90 degrees    Other  Recommendations Oral Care Recommendations: Oral care BID;Oral  care before and after PO Other Recommendations: Order thickener from pharmacy;Prohibited food (jello, ice cream, thin soups);Remove water pitcher   Follow Up Recommendations  Skilled Nursing facility    Frequency and Duration min 2x/week  2 weeks   Pertinent Vitals/Pain N/A    SLP Swallow Goals Patient will consume recommended diet without observed clinical signs of aspiration with: Minimal cueing Patient will utilize recommended strategies during swallow to increase swallowing safety with: Minimal cueing   Swallow Study Prior Functional Status       General Date of Onset: 09/08/12 HPI: 80 y/o female with advanced dementia was brought from a nursing home was admitted on 09/08/2012 for SDH and hypernatremia. Pt with a h/o dysphagia, and a bedside swallowing evaluation was ordered. Per RN report, niece shared that pt has been consuming a Dys. 1 diet with no mention of liquid consistency. Type of Study: Bedside swallow evaluation Previous Swallow Assessment: previous MBS in 2008, 2011 Diet Prior to this Study: NPO Temperature Spikes Noted: No Respiratory Status: Room air History of Recent Intubation: No Behavior/Cognition: Alert;Cooperative;Requires cueing;Other (comment) (intermittently follows directions) Oral Cavity - Dentition: Edentulous Self-Feeding Abilities: Able to feed self Patient Positioning: Upright in bed Baseline Vocal Quality: Clear Volitional Cough: Congested Volitional Swallow: Able to elicit    Oral/Motor/Sensory Function Overall Oral Motor/Sensory Function: Other (comment) (difficult to assess - decreased ability to follow commands) Labial ROM: Within Functional Limits Labial Symmetry: Within Functional Limits Lingual Symmetry: Within Functional Limits Facial Symmetry: Within Functional Limits Mandible: Within Functional Limits   Ice Chips Ice chips: Impaired Presentation: Spoon Oral Phase Impairments: Impaired anterior to posterior transit;Poor awareness  of bolus Oral Phase Functional Implications: Prolonged oral transit;Oral holding Pharyngeal Phase Impairments: Suspected delayed Swallow;Decreased hyoid-laryngeal movement;Wet Vocal Quality   Thin Liquid  Thin Liquid: Impaired Presentation: Cup;Self Fed;Spoon Oral Phase Impairments: Impaired anterior to posterior transit;Poor awareness of bolus Oral Phase Functional Implications: Right anterior spillage;Left anterior spillage;Oral holding Pharyngeal  Phase Impairments: Suspected delayed Swallow;Decreased hyoid-laryngeal movement;Wet Vocal Quality    Nectar Thick Nectar Thick Liquid: Impaired Presentation: Cup;Self Fed;Spoon Oral Phase Impairments: Impaired anterior to posterior transit;Poor awareness of bolus Oral phase functional implications: Oral holding Pharyngeal Phase Impairments: Suspected delayed Swallow;Decreased hyoid-laryngeal movement;Wet Vocal Quality   Honey Thick Honey Thick Liquid: Impaired Presentation: Cup;Self fed;Spoon Oral Phase Impairments: Impaired anterior to posterior transit;Poor awareness of bolus Oral Phase Functional Implications: Oral holding Pharyngeal Phase Impairments: Suspected delayed Swallow;Decreased hyoid-laryngeal movement   Puree Puree: Impaired Presentation: Spoon Oral Phase Impairments: Impaired anterior to posterior transit;Poor awareness of bolus Oral Phase Functional Implications: Left lateral sulci pocketing;Prolonged oral transit;Oral residue;Oral holding Pharyngeal Phase Impairments: Suspected delayed Swallow;Decreased hyoid-laryngeal movement   Solid  Solid: Not tested       Maxcine Ham 09/09/2012,3:27 PM  Maxcine Ham, M.A. CCC-SLP

## 2012-09-09 NOTE — Progress Notes (Signed)
UR completed 

## 2012-09-09 NOTE — Progress Notes (Signed)
CRITICAL VALUE ALERT  Critical value received:  Serum osm 372  Date of notification:  09/09/2012   Time of notification:  0905  Critical value read back:yes  Nurse who received alert:  Shaquila Sigman SMALL   MD notified (1st page):  Dr. Vassie Loll  Time of first page:  0910  MD notified (2nd page):  Time of second page:  Responding MD:  Dr. Vassie Loll  Time MD responded:  [phone]

## 2012-09-09 NOTE — Progress Notes (Signed)
CRITICAL VALUE ALERT  Critical value received:  Na 166  Date of notification:  09/09/2012   Time of notification:  1200  Critical value read back:yes  Nurse who received alert:  Keeon Zurn SMALL  MD notified (1st page):  Dr. Vassie Loll  Time of first page:  1245  MD notified (2nd page):  Time of second page:  Responding MD:  Dr. Vassie Loll  Time MD responded:  1245

## 2012-09-09 NOTE — Progress Notes (Signed)
INITIAL NUTRITION ASSESSMENT  DOCUMENTATION CODES Per approved criteria  -Severe malnutrition in the context of chronic illness -Underweight   INTERVENTION: Recommend SLP evaluation prior to initiation of diet given hx of dysphagia. Monitor magnesium, potassium, and phosphorus daily for at least 3 days, MD to replete as needed, as pt is at risk for refeeding syndrome given dx of severe malnutrition. If unable to advance diet soon, recommend initiation of nutrition support with slow advancement (given refeeding syndrome risk.) RD to continue to follow nutrition care plan.  NUTRITION DIAGNOSIS: Inadequate oral intake related to inability to eat as evidenced by NPO status.   Goal: Diet advancement as tolerated. Intake to meet at least 90% of estimated needs.  Monitor:  weight trends, lab trends, I/O's, diet advancement  Reason for Assessment: Health History  79 y.o. female  Admitting Dx: Subdural hematoma caused by concussion  ASSESSMENT: PMHx significant for dementia and dysphagia. Admitted from SNF with SDH s/p fall and hypernatremia.  Per nursing home [facility], pt takes remeron daily. Currently on hold at this time. RN reports that patient's niece told her that patient consumes pureed foods at baseline. RD unable to confirm this; discussed need for SLP evaluation prior to initiation of PO's. Pt unable to answer questions. Pt is extremely thin with very little muscle and fat mass throughout entire body.  Nutrition Focused Physical Exam:  Subcutaneous Fat:  Orbital Region: severe depletion Upper Arm Region: severe depletion Thoracic and Lumbar Region: severe depletion  Muscle:  Temple Region: severe depletion Clavicle Bone Region: severe depletion Clavicle and Acromion Bone Region: severe depletion Scapular Bone Region: severe depletion Dorsal Hand: severe depletion Patellar Region: severe depletion Anterior Thigh Region: severe depletion Posterior Calf Region: severe  depletion  Edema: none  Pt meets criteria for severe MALNUTRITION in the context of chronic illness as evidenced by severe muscle and fat mass loss.  Height: Ht Readings from Last 1 Encounters:  09/09/12 5\' 9"  (1.753 m)    Weight: Wt Readings from Last 1 Encounters:  09/09/12 87 lb 8.4 oz (39.7 kg)    Ideal Body Weight: 145 lb  % Ideal Body Weight: 60%  Wt Readings from Last 10 Encounters:  09/09/12 87 lb 8.4 oz (39.7 kg)    Usual Body Weight: n/a  % Usual Body Weight: n/a  BMI:  Body mass index is 12.92 kg/(m^2). Underweight  Estimated Nutritional Needs: Kcal: 1200 - 1500 Protein: 48 - 60 g Fluid: at least 1.2 liters daily  Skin: intact  Diet Order: NPO  EDUCATION NEEDS: -No education needs identified at this time   Intake/Output Summary (Last 24 hours) at 09/09/12 0958 Last data filed at 09/09/12 0800  Gross per 24 hour  Intake    530 ml  Output      0 ml  Net    530 ml    Last BM: PTA  Labs:   Recent Labs Lab 09/08/12 1945 09/09/12 0100  NA 164* 166*  K 4.2 4.2  CL 125* 129*  CO2 20 25  BUN 65* 65*  CREATININE 1.40* 1.30*  CALCIUM 9.8 9.6  GLUCOSE 159* 136*    CBG (last 3)   Recent Labs  09/09/12 0027 09/09/12 0416  GLUCAP 107* 95    Scheduled Meds: . insulin aspart  1-3 Units Subcutaneous Q4H  . pantoprazole (PROTONIX) IV  40 mg Intravenous QHS    Continuous Infusions: . dextrose 50 mL/hr at 09/09/12 0800    Past Medical History  Diagnosis Date  . Dementia   .  Dysphasia   . Anemia   . Embolism   . Thrombosis     History reviewed. No pertinent past surgical history.  Jarold Motto MS, RD, LDN Pager: [phone] After-hours pager: [phone]

## 2012-09-09 NOTE — Progress Notes (Addendum)
CRITICAL VALUE ALERT  Critical value received:  Na 166  Date of notification:  161096  Time of notification:  0343 Critical value read back:yes  Nurse who received alert:  Berniece Pap, RN  MD notified (1st page):  Marylin Crosby, RN  Time of first page:  [phone]  MD notified (2nd page):  Time of second page:  Responding MD:  eLink  Time MD responded:  [phone]

## 2012-09-09 NOTE — Clinical Social Work Note (Signed)
Clinical Social Work Department BRIEF PSYCHOSOCIAL ASSESSMENT 09/09/2012  Patient:  [name], [name]     Account Number:  [id number]     Admit date:  09/08/2012  Clinical Social Worker:  Verl Blalock  Date/Time:  09/09/2012 11:15 AM  Referred by:  RN  Date Referred:  09/09/2012 Referred for  ALF Placement   Other Referral:   Memory Care Unit   Interview type:  Family Other interview type:   Spoke with patient niece over the phone due to patient baseline mental status    PSYCHOSOCIAL DATA Living Status:  FACILITY Admitted from facility:  [facility] Level of care:  Assisted Living Primary support name:  [name] [phone] Primary support relationship to patient:  FAMILY Degree of support available:   Fair    CURRENT CONCERNS Current Concerns  Post-Acute Placement   Other Concerns:    SOCIAL WORK ASSESSMENT / PLAN Clinical Social Worker spoke with patient niece to offer support and discuss patient plans at discharge.  Patient niece states that patient is a current resident at [facility]) and plans to return at discharge.  CSW spoke with facility who states that patient is able to return once medically ready. Patient plans to transfer to SDU today if bed becomes available.  CSW remains available for support and to facilitate patient discharge needs once medically stable.   Assessment/plan status:  Psychosocial Support/Ongoing Assessment of Needs Other assessment/ plan:   Information/referral to community resources:   No resources needed at this time per neice.    PATIENT'S/FAMILY'S RESPONSE TO PLAN OF CARE: Patient alert but not oriented due to dementia and recent injury.  Patient only family member per chart documentation is her neice [name]).  Patient family is agreeable with patient return to same facility at discharge.  Patient family aware of social work role and desire to follow up closer to discharge.

## 2012-09-10 ENCOUNTER — Encounter (HOSPITAL_COMMUNITY): Payer: Self-pay | Admitting: *Deleted

## 2012-09-10 LAB — BASIC METABOLIC PANEL
BUN: 31 mg/dL — ABNORMAL HIGH (ref 6–23)
Chloride: 118 mEq/L — ABNORMAL HIGH (ref 96–112)
GFR calc Af Amer: 70 mL/min — ABNORMAL LOW (ref >90)
Glucose, Bld: 112 mg/dL — ABNORMAL HIGH (ref 70–99)
Potassium: 3.3 mEq/L — ABNORMAL LOW (ref 3.5–5.1)
Sodium: 151 mEq/L — ABNORMAL HIGH (ref 135–145)

## 2012-09-10 LAB — GLUCOSE, CAPILLARY
Glucose-Capillary: 105 mg/dL — ABNORMAL HIGH (ref 70–99)
Glucose-Capillary: 51 mg/dL — ABNORMAL LOW (ref 70–99)
Glucose-Capillary: 92 mg/dL (ref 70–99)

## 2012-09-10 LAB — CBC
HCT: 31.2 % — ABNORMAL LOW (ref 36.0–46.0)
Hemoglobin: 9.9 g/dL — ABNORMAL LOW (ref 12.0–15.0)
MCHC: 31.7 g/dL (ref 30.0–36.0)
RBC: 3.71 MIL/uL — ABNORMAL LOW (ref 3.87–5.11)

## 2012-09-10 MED ORDER — SENNA-DOCUSATE SODIUM 8.6-50 MG PO TABS: 1.0000 | ORAL_TABLET | Freq: Every day | ORAL | Status: DC

## 2012-09-10 MED ORDER — SENNOSIDES-DOCUSATE SODIUM 8.6-50 MG PO TABS
1.0000 | ORAL_TABLET | Freq: Every day | ORAL | Status: DC
  Administered 2012-09-10 – 2012-09-12 (×3): 1 via ORAL
  Filled 2012-09-10 (×3): qty 1

## 2012-09-10 MED ORDER — BIOTENE DRY MOUTH MT LIQD
15.0000 mL | Freq: Two times a day (BID) | OROMUCOSAL | Status: DC
  Administered 2012-09-10 – 2012-09-13 (×7): 15 mL via OROMUCOSAL

## 2012-09-10 MED ORDER — DEXTROSE-NACL 5-0.45 % IV SOLN
INTRAVENOUS | Status: DC
  Administered 2012-09-10: 12:00:00 via INTRAVENOUS
  Administered 2012-09-10 – 2012-09-11 (×3): 1000 mL via INTRAVENOUS
  Administered 2012-09-12: 05:00:00 via INTRAVENOUS
  Administered 2012-09-12: 1000 mL via INTRAVENOUS

## 2012-09-10 MED ORDER — POTASSIUM CHLORIDE 20 MEQ/15ML (10%) PO LIQD
20.0000 meq | Freq: Two times a day (BID) | ORAL | Status: DC
  Administered 2012-09-10 – 2012-09-13 (×7): 20 meq via ORAL
  Filled 2012-09-10 (×8): qty 15

## 2012-09-10 MED ORDER — ACETAMINOPHEN 500 MG PO TABS
500.0000 mg | ORAL_TABLET | Freq: Four times a day (QID) | ORAL | Status: DC | PRN
  Administered 2012-09-12: 500 mg via ORAL
  Filled 2012-09-10 (×2): qty 1

## 2012-09-10 MED ORDER — MIRTAZAPINE 15 MG PO TABS
15.0000 mg | ORAL_TABLET | Freq: Every day | ORAL | Status: DC
  Administered 2012-09-10 – 2012-09-12 (×3): 15 mg via ORAL
  Filled 2012-09-10 (×4): qty 1

## 2012-09-10 MED ORDER — LORAZEPAM 0.5 MG PO TABS: 0.5000 mg | ORAL_TABLET | Freq: Three times a day (TID) | ORAL | Status: DC | PRN

## 2012-09-10 NOTE — Progress Notes (Signed)
Speech Language Pathology Dysphagia Treatment Patient Details Name: Kinsley Holderman MRN: [id number] DOB: [birth year] Today's Date: 09/10/2012 Time: 1025-1039 SLP Time Calculation (min): 14 min  Assessment / Plan / Recommendation Clinical Impression  Pt observed during morning meal. No overt s/s aspiration observed. Nsg reported slight cough x2, not directly related to po trials. Pt requires cues to swallow due to cognitive impairment. Poor intake this am. Pt afebrile, lungs stable.    Diet Recommendation  Continue with Current Diet: Dysphagia 1 (puree);Honey-thick liquid    SLP Plan Continue with current plan of care   Pertinent Vitals/Pain No pain indicated    Swallowing Goals  SLP Swallowing Goals Patient will consume recommended diet without observed clinical signs of aspiration with: Minimal cueing Swallow Study Goal #1 - Progress: Progressing toward goal Patient will utilize recommended strategies during swallow to increase swallowing safety with: Minimal cueing Swallow Study Goal #2 - Progress: Progressing toward goal  General Temperature Spikes Noted: No Respiratory Status: Room air Behavior/Cognition: Alert;Cooperative;Requires cueing;Other (comment) Oral Cavity - Dentition: Edentulous Patient Positioning: Upright in bed  Oral Cavity - Oral Hygiene Does patient have any of the following "at risk" factors?: Saliva - thick, dry mouth;Other - dysphagia Brush patient's teeth BID with toothbrush (using toothpaste with fluoride): Yes Patient is AT RISK - Oral Care Protocol followed (see row info): Yes   Dysphagia Treatment Treatment focused on: Skilled observation of diet tolerance;Patient/family/caregiver education Patient observed directly with PO's: Yes Type of PO's observed: Dysphagia 1 (puree);Honey-thick liquids Feeding: Total assist Liquids provided via: Cup Type of cueing: Verbal;Tactile;Visual Amount of cueing: Moderate   GO    Aero Drummonds B. Murvin Natal [facility],  CCC-SLP [phone]  Leigh Aurora 09/10/2012, 10:45 AM

## 2012-09-10 NOTE — Progress Notes (Signed)
Patient transferred to unit 4N bed 17 with patient belongings.  Patient remains stable with no current complaints or changes.  Kayla Mings RN at beside.  Jacqulynn Cadet

## 2012-09-10 NOTE — Progress Notes (Addendum)
[facility] Progress Note [place] TEAM 1 - Stepdown/ICU TEAM   Lizett Chowning ZOX:[id number] DOB: [birth year] DOA: 09/08/2012 PCP: No primary provider on file.  Brief narrative: 79 y/o female brought from a nursing home on 09/08/2012 and ultimately admitted by PCCM for SDH and hypernatremia.  Apparently she fell at her nursing home on 8/20 and was noted to be confused later so she was sent to the ED. No staff members from the nursing home were available to discuss her care and her HCPOA had been out of town and was unaware of any recent illnesses or change in her status.   SIGNIFICANT EVENTS / STUDIES:  8/20 CT head > acute intracranial hemorrhages including subarachnoid, subdural and parenchymal bleeding; no mass effect or midline shift, small hemorrhages; probable maxillary and frontal sinusitis  8/20 CT neck > no acute or traumatic finding  Assessment/Plan:  Acute traumatic intracerebral/subarachnoid/subdural hemorrhage apparently from a fall Not felt to be a candidate for surgical intervention, even if condition should worsen - accordingly, f/u CT scans not felt to be of clinical utility - appears to be approaching baseline MS   Baseline dementia eats but doesn't walk, not capable of normal conversation - cleared for dysphagia 1 (puree) honey-thick liquid diet per SLP eval   Paroxysmal afib? No evidence of same at this time - cont to monitor on tele for now - perhaps pt was experiencing DH relate tachy earlier in hospital stay   Hypernatremia presumably due to dehydration - improving with free water - cont to follow  Hypokalemia Likely due to poor intake - supplement - check Mg - follow trend   COPD Well compensated at this time - on no chronic meds  Code Status: NO CPR - NO DEFIB - short term intubation OK Family Communication: no family present at time of exam  Disposition Plan: transfer to Neuro tele bed - cont to monitor mental status - monitor oral intake  - watch Na and K+ trends - watch tele for arrythmia - probable return to [facility] 8/25  Consultants: Neurosurgery PCCM >> TRH  Procedures: none  Antibiotics: none  DVT prophylaxis: SCDs  HPI/Subjective: Pt is awake, but non-communicative.  She is unable to provide a hx.  She does not appear to be uncomfortable or in acute distress.  Objective: Blood pressure 109/52, pulse 66, temperature 97.5 F (36.4 C), temperature source Oral, resp. rate 14, height 5\' 9"  (1.753 m), weight 42.9 kg (94 lb 9.2 oz), SpO2 100.00%.  Intake/Output Summary (Last 24 hours) at 09/10/12 1054 Last data filed at 09/10/12 0600  Gross per 24 hour  Intake   2000 ml  Output      0 ml  Net   2000 ml   Exam: General: No acute respiratory distress Lungs: Clear to auscultation bilaterally without wheezes or crackles Cardiovascular: Regular rate and rhythm without murmur gallop or rub normal S1 and S2 Abdomen: very thin, nontender, nondistended, soft, bowel sounds positive, no rebound, no ascites, no appreciable mass Extremities: No significant cyanosis, clubbing, or edema L LE - R LE s/p remote amputation   Data Reviewed: Basic Metabolic Panel:  Recent Labs Lab 09/08/12 1945 09/09/12 0100 09/09/12 1134 09/09/12 2037 09/10/12 0919  NA 164* 166* 166* 158* 151*  K 4.2 4.2 3.9 4.0 3.3*  CL 125* 129* 130* 126* 118*  CO2 20 25 24 20 24   GLUCOSE 159* 136* 135* 132* 112*  BUN 65* 65* 56* 47* 31*  CREATININE 1.40* 1.30* 1.09 0.86 0.84  CALCIUM  9.8 9.6 9.5 9.1 9.0   Liver Function Tests:  Recent Labs Lab 09/08/12 1945  AST 61*  ALT 55*  ALKPHOS 96  BILITOT 0.4  PROT 8.9*  ALBUMIN 3.3*   CBC:  Recent Labs Lab 09/08/12 1945 09/09/12 0200 09/10/12 0919  WBC 17.4* 18.4* 13.2*  NEUTROABS 15.5*  --   --   HGB 13.1 12.6 9.9*  HCT 42.3 38.3 31.2*  MCV 84.9 84.2 84.1  PLT 258 246 205   CBG:  Recent Labs Lab 09/09/12 1221 09/09/12 1550 09/09/12 1959 09/10/12 0049 09/10/12 0436  GLUCAP  117* 133* 105* 105* 92    Recent Results (from the past 240 hour(s))  URINE CULTURE     Status: None   Collection Time    09/08/12  7:43 PM      Result Value Range Status   Specimen Description URINE, RANDOM   Final   Special Requests NONE   Final   Culture  Setup Time     Final   Value: 09/09/2012 01:48     Performed at [facility] Count     Final   Value: NO GROWTH     Performed at [facility]   Culture     Final   Value: NO GROWTH     Performed at [facility]   Report Status 09/09/2012 FINAL   Final  MRSA PCR SCREENING     Status: None   Collection Time    09/08/12 11:17 PM      Result Value Range Status   MRSA by PCR NEGATIVE  NEGATIVE Final   Comment:            The GeneXpert MRSA Assay (FDA     approved for NASAL specimens     only), is one component of a     comprehensive MRSA colonization     surveillance program. It is not     intended to diagnose MRSA     infection nor to guide or     monitor treatment for     MRSA infections.  CULTURE, BLOOD (ROUTINE X 2)     Status: None   Collection Time    09/09/12  2:00 AM      Result Value Range Status   Specimen Description BLOOD RIGHT ARM   Final   Special Requests     Final   Value: BOTTLES DRAWN AEROBIC AND ANAEROBIC 10CC BLUE,5CC RED   Culture  Setup Time     Final   Value: 09/09/2012 09:07     Performed at [facility]   Culture     Final   Value:        BLOOD CULTURE RECEIVED NO GROWTH TO DATE CULTURE WILL BE HELD FOR 5 DAYS BEFORE ISSUING A FINAL NEGATIVE REPORT     Performed at [facility]   Report Status PENDING   Incomplete  CULTURE, BLOOD (ROUTINE X 2)     Status: None   Collection Time    09/09/12  2:15 AM      Result Value Range Status   Specimen Description BLOOD RIGHT HAND   Final   Special Requests BOTTLES DRAWN AEROBIC ONLY 5CC   Final   Culture  Setup Time     Final   Value: 09/09/2012 09:07     Performed at [facility]   Culture      Final   Value:  BLOOD CULTURE RECEIVED NO GROWTH TO DATE CULTURE WILL BE HELD FOR 5 DAYS BEFORE ISSUING A FINAL NEGATIVE REPORT     Performed at [facility]   Report Status PENDING   Incomplete     Studies:  Recent x-ray studies have been reviewed in detail by the Attending Physician  Scheduled Meds:  Scheduled Meds: . antiseptic oral rinse  15 mL Mouth Rinse BID  . insulin aspart  1-3 Units Subcutaneous Q4H  . pantoprazole (PROTONIX) IV  40 mg Intravenous QHS    Time spent on care of this patient:  [facility] T  [facility] Office  [phone] Pager - Text Page per Loretha Stapler as per below:  On-Call/Text Page:      [email or web address]      password [email or web address]  If 7PM-7AM, please contact night-coverage [email or web address] Password [facility] 09/10/2012, 10:54 AM   LOS: 2 days

## 2012-09-11 LAB — BASIC METABOLIC PANEL
CO2: 23 mEq/L (ref 19–32)
Calcium: 8.6 mg/dL (ref 8.4–10.5)
Chloride: 117 mEq/L — ABNORMAL HIGH (ref 96–112)
Creatinine, Ser: 0.72 mg/dL (ref 0.50–1.10)
GFR calc Af Amer: 87 mL/min — ABNORMAL LOW (ref >90)
Sodium: 149 mEq/L — ABNORMAL HIGH (ref 135–145)

## 2012-09-11 LAB — MAGNESIUM: Magnesium: 2.3 mg/dL (ref 1.5–2.5)

## 2012-09-11 NOTE — Evaluation (Signed)
Physical Therapy Evaluation Patient Details Name: [name] MRN: [id number] DOB: 23-Dec-1922 Today's Date: 09/11/2012 Time: 4098-1191 PT Time Calculation (min): 16 min  PT Assessment / Plan / Recommendation History of Present Illness  advanced dementia, s/p fall with SDH  Clinical Impression  Pt with advanced dementia and has been nonambulatory for extended period of time.  She is resistant to any movement out of bed and presents with BLE contractors in flexed position.  Pt is not appropriate for skilled PT in the acute care setting.    PT Assessment  Patent does not need any further PT services    Follow Up Recommendations  No PT follow up;SNF    Does the patient have the potential to tolerate intense rehabilitation      Barriers to Discharge        Equipment Recommendations  None recommended by PT    Recommendations for Other Services     Frequency      Precautions / Restrictions Precautions Precautions: Fall   Pertinent Vitals/Pain       Mobility  Bed Mobility Bed Mobility: Rolling Right;Rolling Left;Supine to Sit;Sit to Supine Rolling Right: 1: +2 Total assist Rolling Right: Patient Percentage: 0% Rolling Left: 1: +2 Total assist Rolling Left: Patient Percentage: 0% Supine to Sit: 1: +2 Total assist Supine to Sit: Patient Percentage: 0% Sit to Supine: 1: +2 Total assist Sit to Supine: Patient Percentage: 0% Details for Bed Mobility Assistance: Pt very resistant to sitting EOB requiring return to supine.    Exercises     PT Diagnosis:    PT Problem List:   PT Treatment Interventions:       PT Goals(Current goals can be found in the care plan section)    Visit Information  Last PT Received On: 09/11/12 Assistance Needed: +2 History of Present Illness: advanced dementia, s/p fall with SDH       Prior Functioning  Home Living Family/patient expects to be discharged to:: Skilled nursing facility Prior Function Level of Independence:  Needs assistance Communication Communication: No difficulties    Cognition  Cognition Arousal/Alertness: Awake/alert Behavior During Therapy: Flat affect Overall Cognitive Status: History of cognitive impairments - at baseline Memory: Decreased short-term memory;Decreased recall of precautions    Extremity/Trunk Assessment Upper Extremity Assessment Upper Extremity Assessment: Defer to OT evaluation Lower Extremity Assessment Lower Extremity Assessment: RLE deficits/detail;LLE deficits/detail RLE Deficits / Details: 90 degree hip flexor contractor of R residual limb (s/p AKA) LLE Deficits / Details: knee flexor and hip flexor contractors at approx 90 degrees Cervical / Trunk Assessment Cervical / Trunk Assessment: Kyphotic   Balance    End of Session PT - End of Session Activity Tolerance: Treatment limited secondary to agitation Patient left: in bed;with call bell/phone within reach Nurse Communication: Mobility status  GP     Ilda Foil 09/11/2012, 12:23 PM  Aida Raider, PT  Office # [phone] Pager [phone]

## 2012-09-11 NOTE — Progress Notes (Addendum)
[facility] Progress Note    [name] UEA:[id number] DOB: 23-Jan-1922 DOA: 09/08/2012 PCP: No primary provider on file.  Brief narrative: 79 y/o female brought from a nursing home on 09/08/2012 and ultimately admitted by PCCM for SDH and hypernatremia.  Apparently she fell at her nursing home on 8/20 and was noted to be confused later so she was sent to the ED. No staff members from the nursing home were available to discuss her care and her HCPOA had been out of town and was unaware of any recent illnesses or change in her status.   SIGNIFICANT EVENTS / STUDIES:  8/20 CT head > acute intracranial hemorrhages including subarachnoid, subdural and parenchymal bleeding; no mass effect or midline shift, small hemorrhages; probable maxillary and frontal sinusitis  8/20 CT neck > no acute or traumatic finding  Assessment/Plan:  Acute traumatic intracerebral/subarachnoid/subdural hemorrhage apparently from a fall Not felt to be a candidate for surgical intervention, even if condition should worsen - accordingly, f/u CT scans not felt to be of clinical utility - appears to be approaching baseline MS   Baseline dementia eats but doesn't walk, not capable of normal conversation - cleared for dysphagia 1 (puree) honey-thick liquid diet per SLP eval   Paroxysmal afib? No evidence of same at this time - cont to monitor on tele for now - perhaps pt was experiencing DH relate tachy earlier in hospital stay   Hypernatremia presumably due to dehydration - improving with free water/IVF - cont to follow  Hypokalemia Likely due to poor intake - supplement - check Mg - follow trend   COPD Well compensated at this time - on no chronic meds  Code Status: NO CPR - NO DEFIB - short term intubation OK Family Communication: no family present at time of exam  Disposition Plan:  probable return to ALF 8/25- PT/OT  Consultants: Neurosurgery PCCM >>  TRH  Procedures: none  Antibiotics: none  DVT prophylaxis: SCDs  HPI/Subjective: Answers simple questions No fevers no chills no HA  Objective: Blood pressure 115/45, pulse 99, temperature 98.3 F (36.8 C), temperature source Oral, resp. rate 18, height 5\' 9"  (1.753 m), weight 42.8 kg (94 lb 5.7 oz), SpO2 100.00%. No intake or output data in the 24 hours ending 09/11/12 1021 Exam: General: No acute respiratory distress Lungs: Clear to auscultation bilaterally without wheezes or crackles Cardiovascular: Regular rate and rhythm without murmur gallop or rub normal S1 and S2 Abdomen: very thin, nontender, nondistended, soft, bowel sounds positive, no rebound, no ascites, no appreciable mass Extremities: No significant cyanosis, clubbing, or edema L LE - R LE s/p remote amputation   Data Reviewed: Basic Metabolic Panel:  Recent Labs Lab 09/09/12 0100 09/09/12 1134 09/09/12 2037 09/10/12 0919 09/11/12 0405  NA 166* 166* 158* 151* 149*  K 4.2 3.9 4.0 3.3* 3.5  CL 129* 130* 126* 118* 117*  CO2 25 24 20 24 23   GLUCOSE 136* 135* 132* 112* 112*  BUN 65* 56* 47* 31* 16  CREATININE 1.30* 1.09 0.86 0.84 0.72  CALCIUM 9.6 9.5 9.1 9.0 8.6  MG  --   --   --   --  2.3   Liver Function Tests:  Recent Labs Lab 09/08/12 1945  AST 61*  ALT 55*  ALKPHOS 96  BILITOT 0.4  PROT 8.9*  ALBUMIN 3.3*   CBC:  Recent Labs Lab 09/08/12 1945 09/09/12 0200 09/10/12 0919  WBC 17.4* 18.4* 13.2*  NEUTROABS 15.5*  --   --   HGB  13.1 12.6 9.9*  HCT 42.3 38.3 31.2*  MCV 84.9 84.2 84.1  PLT 258 246 205   CBG:  Recent Labs Lab 09/10/12 0436 09/10/12 0734 09/10/12 0746 09/10/12 1144 09/10/12 1558  GLUCAP 92 51* 77 104* 110*    Recent Results (from the past 240 hour(s))  URINE CULTURE     Status: None   Collection Time    09/08/12  7:43 PM      Result Value Range Status   Specimen Description URINE, RANDOM   Final   Special Requests NONE   Final   Culture  Setup Time      Final   Value: 09/09/2012 01:48     Performed at [facility] Count     Final   Value: NO GROWTH     Performed at [facility]   Culture     Final   Value: NO GROWTH     Performed at [facility]   Report Status 09/09/2012 FINAL   Final  MRSA PCR SCREENING     Status: None   Collection Time    09/08/12 11:17 PM      Result Value Range Status   MRSA by PCR NEGATIVE  NEGATIVE Final   Comment:            The GeneXpert MRSA Assay (FDA     approved for NASAL specimens     only), is one component of a     comprehensive MRSA colonization     surveillance program. It is not     intended to diagnose MRSA     infection nor to guide or     monitor treatment for     MRSA infections.  CULTURE, BLOOD (ROUTINE X 2)     Status: None   Collection Time    09/09/12  2:00 AM      Result Value Range Status   Specimen Description BLOOD RIGHT ARM   Final   Special Requests     Final   Value: BOTTLES DRAWN AEROBIC AND ANAEROBIC 10CC BLUE,5CC RED   Culture  Setup Time     Final   Value: 09/09/2012 09:07     Performed at [facility]   Culture     Final   Value:        BLOOD CULTURE RECEIVED NO GROWTH TO DATE CULTURE WILL BE HELD FOR 5 DAYS BEFORE ISSUING A FINAL NEGATIVE REPORT     Performed at [facility]   Report Status PENDING   Incomplete  CULTURE, BLOOD (ROUTINE X 2)     Status: None   Collection Time    09/09/12  2:15 AM      Result Value Range Status   Specimen Description BLOOD RIGHT HAND   Final   Special Requests BOTTLES DRAWN AEROBIC ONLY 5CC   Final   Culture  Setup Time     Final   Value: 09/09/2012 09:07     Performed at [facility]   Culture     Final   Value:        BLOOD CULTURE RECEIVED NO GROWTH TO DATE CULTURE WILL BE HELD FOR 5 DAYS BEFORE ISSUING A FINAL NEGATIVE REPORT     Performed at [facility]   Report Status PENDING   Incomplete     Studies:  Recent x-ray studies have been reviewed in detail  by the Attending Physician  Scheduled Meds:  Scheduled Meds: .  antiseptic oral rinse  15 mL Mouth Rinse BID  . mirtazapine  15 mg Oral QHS  . potassium chloride  20 mEq Oral BID  . senna-docusate  1 tablet Oral QHS    Time spent on care of this patient:  Renata Gambino  [phone]  If 7PM-7AM, please contact night-coverage [email or web address] Password [facility] 09/11/2012, 10:21 AM   LOS: 3 days

## 2012-09-11 NOTE — Clinical Social Work Note (Signed)
CSW reviewed chart.  Pt is from [facility].  CSW contacted [facility] to confirm that this is an Assisted Living.  [phone])  CSW left message for Admissions Director, Arnell Sieving.  CSW confirmed that pt is a resident there.  Pt will need PT/OT recommendations prior to d/c for ALF to review for possible return.  MD made aware.  CSW continues to follow.  Vickii Penna, LCSWA [phone]  Clinical Social Work

## 2012-09-12 NOTE — Evaluation (Signed)
Occupational Therapy Evaluation Patient Details Name: [name] MRN: [id number] DOB: Oct 30, 1922 Today's Date: 09/12/2012 Time: 1345-1400 OT Time Calculation (min): 15 min  OT Assessment / Plan / Recommendation History of present illness advanced dementia, s/p fall with SDH   Clinical Impression   Pt admitted with above. Unsure of pt's PLOF due to pt unable to state and no family present.  Pt max - total assist with BADLs and bed mobility. Recommend return to SNF.  All further OT needs can be addressed at next venue of care.    OT Assessment  All further OT needs can be met in the next venue of care    Follow Up Recommendations  SNF    Barriers to Discharge      Equipment Recommendations       Recommendations for Other Services    Frequency       Precautions / Restrictions Precautions Precautions: Fall   Pertinent Vitals/Pain See vitals    ADL  Grooming: Performed;Wash/dry face;Maximal assistance Where Assessed - Grooming: Supine, head of bed up Upper Body Bathing: Simulated;+1 Total assistance Where Assessed - Upper Body Bathing: Supine, head of bed up Lower Body Bathing: Simulated;+1 Total assistance Where Assessed - Lower Body Bathing: Supine, head of bed up;Rolling right and/or left Upper Body Dressing: Simulated;+1 Total assistance Where Assessed - Upper Body Dressing: Supine, head of bed up Lower Body Dressing: Simulated;+1 Total assistance Where Assessed - Lower Body Dressing: Supine, head of bed up;Rolling right and/or left ADL Comments: Hand over hand assist to wash face. Pt able to initiate task with max verbal cues but required assist complete grooming task.    OT Diagnosis:    OT Problem List:   OT Treatment Interventions:     OT Goals(Current goals can be found in the care plan section) Acute Rehab OT Goals Patient Stated Goal: did not state  Visit Information  Last OT Received On: 09/12/12 Assistance Needed: +2 History of Present  Illness: advanced dementia, s/p fall with SDH       Prior Functioning     Home Living Family/patient expects to be discharged to:: Skilled nursing facility Prior Function Comments: No family/caregiver available to provide information. Communication Communication: No difficulties         Vision/Perception Vision - History Patient Visual Report:  (pt unable to report)   Cognition  Cognition Arousal/Alertness: Lethargic Behavior During Therapy: Flat affect Overall Cognitive Status: History of cognitive impairments - at baseline    Extremity/Trunk Assessment Upper Extremity Assessment Upper Extremity Assessment: Difficult to assess due to impaired cognition;Generalized weakness     Mobility Bed Mobility Bed Mobility: Rolling Right;Rolling Left Rolling Right: 1: +1 Total assist Rolling Right: Patient Percentage: 0% Rolling Left: 1: +1 Total assist Rolling Left: Patient Percentage: 0% Details for Bed Mobility Assistance: Pt not assisting with bed mobility. Used draw pad to assist in rolling pt.     Exercise     Balance     End of Session OT - End of Session Activity Tolerance: Patient limited by lethargy Patient left: in bed;with call bell/phone within reach;with bed alarm set  GO    09/12/2012 Cipriano Mile OTR/L Pager [phone] Office [phone]  Cipriano Mile 09/12/2012, 2:10 PM

## 2012-09-12 NOTE — Progress Notes (Signed)
CSW reviewed chart and noted that pt was assessed by social work on 8/21 and noted plan is to return to [facility] memory care unit upon d/c.  CSW contacted pt niece, [name] [phone], who confirmed that she is hopeful that pt can return to [facility]F. Pt niece reports that pt was non-ambulatory prior to admission and required extensive care at the ALF. Per pt niece, she plans to come to hospital tomorrow morning and be available to discuss with [facility] about pt care needs.  CSW attempted to contact [facility], but was unable to reach anyone at the facility and left voice message.  Weekday CSW to follow up with [facility] re: pt care needs and assist with pt return to [facility] as long as facility can continue to meet pt needs.  Jacklynn Lewis, MSW, [employer]  Clinical Social Work Weekend coverage [phone]

## 2012-09-12 NOTE — Progress Notes (Signed)
[facility] Progress Note    [name] ZOX:[id number] DOB: 1923-01-09 DOA: 09/08/2012 PCP: No primary provider on file.  Brief narrative: 79 y/o female brought from a nursing home on 09/08/2012 and ultimately admitted by PCCM for SDH and hypernatremia.  Apparently she fell at her nursing home on 8/20 and was noted to be confused later so she was sent to the ED. No staff members from the nursing home were available to discuss her care and her HCPOA had been out of town and was unaware of any recent illnesses or change in her status.   SIGNIFICANT EVENTS / STUDIES:  8/20 CT head > acute intracranial hemorrhages including subarachnoid, subdural and parenchymal bleeding; no mass effect or midline shift, small hemorrhages; probable maxillary and frontal sinusitis  8/20 CT neck > no acute or traumatic finding  Assessment/Plan:  Acute traumatic intracerebral/subarachnoid/subdural hemorrhage apparently from a fall Not felt to be a candidate for surgical intervention, even if condition should worsen - accordingly, f/u CT scans not felt to be of clinical utility - appears to be approaching baseline MS   Baseline dementia eats but doesn't walk, not capable of normal conversation - cleared for dysphagia 1 (puree) honey-thick liquid diet per SLP eval   Paroxysmal afib? No evidence of same at this time - cont to monitor on tele for now - perhaps pt was experiencing DH relate tachy earlier in hospital stay   Hypernatremia presumably due to dehydration - improving with free water/IVF - cont to follow  Hypokalemia Likely due to poor intake - supplement - check Mg - follow trend   COPD Well compensated at this time - on no chronic meds  Code Status: NO CPR - NO DEFIB - short term intubation OK Family Communication: no family present at time of exam  Disposition Plan:  probable return to ALF 8/25- PT/OT vs need for SNF  Consultants: Neurosurgery PCCM >>  TRH  Procedures: none  Antibiotics: none  DVT prophylaxis: SCDs  HPI/Subjective: Answers simple questions No fevers no chills no HA  Objective: Blood pressure 136/93, pulse 83, temperature 98.3 F (36.8 C), temperature source Oral, resp. rate 18, height 5\' 9"  (1.753 m), weight 46.7 kg (102 lb 15.3 oz), SpO2 90.00%.  Intake/Output Summary (Last 24 hours) at 09/12/12 1017 Last data filed at 09/11/12 1800  Gross per 24 hour  Intake    150 ml  Output      0 ml  Net    150 ml   Exam: General: No acute respiratory distress Lungs: Clear to auscultation bilaterally without wheezes or crackles Cardiovascular: Regular rate and rhythm without murmur gallop or rub normal S1 and S2 Abdomen: very thin, nontender, nondistended, soft, bowel sounds positive, no rebound, no ascites, no appreciable mass Extremities: No significant cyanosis, clubbing, or edema L LE - R LE s/p remote amputation   Data Reviewed: Basic Metabolic Panel:  Recent Labs Lab 09/09/12 0100 09/09/12 1134 09/09/12 2037 09/10/12 0919 09/11/12 0405  NA 166* 166* 158* 151* 149*  K 4.2 3.9 4.0 3.3* 3.5  CL 129* 130* 126* 118* 117*  CO2 25 24 20 24 23   GLUCOSE 136* 135* 132* 112* 112*  BUN 65* 56* 47* 31* 16  CREATININE 1.30* 1.09 0.86 0.84 0.72  CALCIUM 9.6 9.5 9.1 9.0 8.6  MG  --   --   --   --  2.3   Liver Function Tests:  Recent Labs Lab 09/08/12 1945  AST 61*  ALT 55*  ALKPHOS 96  BILITOT 0.4  PROT 8.9*  ALBUMIN 3.3*   CBC:  Recent Labs Lab 09/08/12 1945 09/09/12 0200 09/10/12 0919  WBC 17.4* 18.4* 13.2*  NEUTROABS 15.5*  --   --   HGB 13.1 12.6 9.9*  HCT 42.3 38.3 31.2*  MCV 84.9 84.2 84.1  PLT 258 246 205   CBG:  Recent Labs Lab 09/10/12 0436 09/10/12 0734 09/10/12 0746 09/10/12 1144 09/10/12 1558  GLUCAP 92 51* 77 104* 110*    Recent Results (from the past 240 hour(s))  URINE CULTURE     Status: None   Collection Time    09/08/12  7:43 PM      Result Value Range  Status   Specimen Description URINE, RANDOM   Final   Special Requests NONE   Final   Culture  Setup Time     Final   Value: 09/09/2012 01:48     Performed at [facility] Count     Final   Value: NO GROWTH     Performed at [facility]   Culture     Final   Value: NO GROWTH     Performed at [facility]   Report Status 09/09/2012 FINAL   Final  MRSA PCR SCREENING     Status: None   Collection Time    09/08/12 11:17 PM      Result Value Range Status   MRSA by PCR NEGATIVE  NEGATIVE Final   Comment:            The GeneXpert MRSA Assay (FDA     approved for NASAL specimens     only), is one component of a     comprehensive MRSA colonization     surveillance program. It is not     intended to diagnose MRSA     infection nor to guide or     monitor treatment for     MRSA infections.  CULTURE, BLOOD (ROUTINE X 2)     Status: None   Collection Time    09/09/12  2:00 AM      Result Value Range Status   Specimen Description BLOOD RIGHT ARM   Final   Special Requests     Final   Value: BOTTLES DRAWN AEROBIC AND ANAEROBIC 10CC BLUE,5CC RED   Culture  Setup Time     Final   Value: 09/09/2012 09:07     Performed at [facility]   Culture     Final   Value:        BLOOD CULTURE RECEIVED NO GROWTH TO DATE CULTURE WILL BE HELD FOR 5 DAYS BEFORE ISSUING A FINAL NEGATIVE REPORT     Performed at [facility]   Report Status PENDING   Incomplete  CULTURE, BLOOD (ROUTINE X 2)     Status: None   Collection Time    09/09/12  2:15 AM      Result Value Range Status   Specimen Description BLOOD RIGHT HAND   Final   Special Requests BOTTLES DRAWN AEROBIC ONLY 5CC   Final   Culture  Setup Time     Final   Value: 09/09/2012 09:07     Performed at [facility]   Culture     Final   Value:        BLOOD CULTURE RECEIVED NO GROWTH TO DATE CULTURE WILL BE HELD FOR 5 DAYS BEFORE ISSUING A FINAL NEGATIVE REPORT     Performed  at [facility]   Report Status PENDING   Incomplete     Studies:  Recent x-ray studies have been reviewed in detail by the Attending Physician  Scheduled Meds:  Scheduled Meds: . antiseptic oral rinse  15 mL Mouth Rinse BID  . mirtazapine  15 mg Oral QHS  . potassium chloride  20 mEq Oral BID  . senna-docusate  1 tablet Oral QHS    Time spent on care of this patient:  Raguel Kosloski  [phone]  If 7PM-7AM, please contact night-coverage [email or web address] Password [facility] 09/12/2012, 10:17 AM   LOS: 4 days

## 2012-09-13 LAB — CBC
Platelets: 247 10*3/uL (ref 150–400)
RBC: 3.98 MIL/uL (ref 3.87–5.11)
RDW: 16.9 % — ABNORMAL HIGH (ref 11.5–15.5)
WBC: 8.8 10*3/uL (ref 4.0–10.5)

## 2012-09-13 LAB — BASIC METABOLIC PANEL
CO2: 25 mEq/L (ref 19–32)
Calcium: 9.2 mg/dL (ref 8.4–10.5)
Chloride: 109 mEq/L (ref 96–112)
Creatinine, Ser: 0.66 mg/dL (ref 0.50–1.10)
GFR calc Af Amer: 89 mL/min — ABNORMAL LOW (ref >90)
Sodium: 144 mEq/L (ref 135–145)

## 2012-09-13 MED ORDER — POTASSIUM CHLORIDE 20 MEQ/15ML (10%) PO LIQD: 20.0000 meq | Freq: Two times a day (BID) | ORAL | Status: DC

## 2012-09-13 NOTE — Clinical Social Work Note (Signed)
Pt has been approved to return to [facility]. CSW contacted pt's daughter, Ms. Charyl Dancer [phone]) regarding pt's discharge to [facility] today. Pt's daughter stated that she would meet her mother at the facility and thanked CSW for their support. CSW to assist with pt's discharge.  Darlyn Chamber, MSW, LCSWA Clinical Social Work [phone]

## 2012-09-13 NOTE — Discharge Summary (Signed)
Physician Discharge Summary  [name] ZOX:[id number] DOB: [birth year] DOA: 09/08/2012  PCP: No primary provider on file.  Admit date: 09/08/2012 Discharge date: 09/13/2012  Time spent: 35 minutes  Recommendations for Outpatient Follow-up:  1. BMP 1 week  Discharge Diagnoses:  Principal Problem:   Subdural hematoma caused by concussion Active Problems:   Subarachnoid hemorrhage following injury   Dementia   Hypernatremia   Discharge Condition: improved  Diet recommendation: DYS 1 nectar  Filed Weights   09/11/12 0500 09/12/12 0507 09/13/12 0600  Weight: 42.8 kg (94 lb 5.7 oz) 46.7 kg (102 lb 15.3 oz) 48.5 kg (106 lb 14.8 oz)    History of present illness:  79 y/o female was brought from a nursing home was admitted on 09/08/2012 for SDH and hypernatremia.  Apparently she fell at her nursing home on 8/20 and was noted to be confused later so she was sent to the ED. No staff members from the nursing home were available to discuss her care and her HCPOA has been out of town and was unaware of any recent illnesses or change in her status. History is obtained from chart review only.   Hospital Course:  Acute traumatic intracerebral/subarachnoid/subdural hemorrhage apparently from a fall  Not felt to be a candidate for surgical intervention, even if condition should worsen - accordingly, f/u CT scans not felt to be of clinical utility - appears to be approaching baseline MS   Baseline dementia  eats but doesn't walk, not capable of normal conversation - cleared for dysphagia 1 (puree) honey-thick liquid diet per SLP eval   Paroxysmal afib?  No evidence of same at this time - cont to monitor on tele for now - perhaps pt was experiencing DH relate tachy earlier in hospital stay   Hypernatremia  presumably due to dehydration - improving with free water/IVF - will reoccur  Hypokalemia  replete   COPD  Well compensated at this time - on no chronic  meds   Procedures:  none  Consultations:  Critical care  Discharge Exam: Filed Vitals:   09/13/12 0600  BP: 139/70  Pulse: 89  Temp: 98.2 F (36.8 C)  Resp: 18    General: pleasant Cardiovascular: rrr Respiratory: clear  Discharge Instructions      Discharge Orders   Future Orders Complete By Expires   Discharge instructions  As directed    Comments:     BMp 1 week re K/Na -would benefit from palliative care consult at some point when family ready -DYS 1 diet nectar thick Home health PT/OT/SLP to follow   Increase activity slowly  As directed        Medication List    STOP taking these medications       aspirin 81 MG tablet     guaiFENesin 100 MG/5ML liquid  Commonly known as:  ROBITUSSIN     iron polysaccharides 150 MG capsule  Commonly known as:  NIFEREX     loperamide 2 MG tablet  Commonly known as:  IMODIUM A-D     magnesium hydroxide 400 MG/5ML suspension  Commonly known as:  MILK OF MAGNESIA     multivitamin with minerals Tabs tablet      TAKE these medications       acetaminophen 500 MG tablet  Commonly known as:  TYLENOL  Take 500 mg by mouth every 6 (six) hours as needed for pain.     LORazepam 0.5 MG tablet  Commonly known as:  ATIVAN  Take 0.5 mg  by mouth every 8 (eight) hours as needed for anxiety.     mirtazapine 15 MG tablet  Commonly known as:  REMERON  Take 15 mg by mouth at bedtime.     potassium chloride 20 MEQ/15ML (10%) solution  Take 15 mLs (20 mEq total) by mouth 2 (two) times daily.     PRESCRIPTION MEDICATION  Take 1 Can by mouth 4 (four) times daily. Mighty Shakes (4 oz)     sennosides-docusate sodium 8.6-50 MG tablet  Commonly known as:  SENOKOT-S  Take 1 tablet by mouth at bedtime.     Vitamin D (Ergocalciferol) 50000 UNITS Caps capsule  Commonly known as:  DRISDOL  Take 50,000 Units by mouth See admin instructions. Take 1 capsule every month on the 17th       No Known Allergies    The results  of significant diagnostics from this hospitalization (including imaging, microbiology, ancillary and laboratory) are listed below for reference.    Significant Diagnostic Studies: Ct Head Wo Contrast  09/08/2012   *RADIOLOGY REPORT*  Clinical Data: Headache.  Vertigo.  CT HEAD WITHOUT CONTRAST  Technique:  Contiguous axial images were obtained from the base of the skull through the vertex without contrast.  Comparison: CT scan dated 12/11/2006  Findings: There is a small hemorrhagic contusion of the right frontal lobe adjacent to the sylvian fissure visible on images 13 and 14 of series 2.  There is also a small subdural hematoma along the anterior aspect of the interhemispheric falx as well as inferior to the frontal lobe in the midline.  There is a small amount of subarachnoid hemorrhage in the left subfrontal region as well as a tiny left subdural hematoma over the left frontal lobe seen on image number 13 of series 2.  There is diffuse chronic cerebral cortical and cerebellar atrophy. The ventricles are not dilated.  The patient does have air fluid levels in both maxillary sinuses with extensive partial opacification of the frontal sinus consistent with acute sinusitis.  There is extensive cerumen and both external auditory canals. Mastoid air cells are clear.  No skull fractures.  Chronic calcification of a portion of the lobe of the right eye.  IMPRESSION:  The patient has acute intracranial hemorrhages including subarachnoid, subdural, and parenchymal, consistent with a fall.  There is no mass effect or midline shift.  The hemorrhages are small.  Probable acute maxillary and frontal sinusitis.   Original Report Authenticated By: Francene Boyers, M.D.   Ct Cervical Spine Wo Contrast  09/08/2012   *RADIOLOGY REPORT*  Clinical Data: Dementia.  Mental status changes.  Intracranial hemorrhage appear  CT CERVICAL SPINE WITHOUT CONTRAST  Technique:  Multidetector CT imaging of the cervical spine was performed.  Multiplanar CT image reconstructions were also generated.  Comparison: None  Findings: No fracture is seen in the cervical region.  There is degenerative arthritis of the C1-2 articulation without encroachment upon the neural spaces.  C2-3 shows mild spondylosis and facet arthropathy without significant stenosis.  There is fusion in the region from C3-T1 with anterior bridging osteophytes. This could indicate diffuse idiopathic skeletal hyperostosis. There are endplate osteophytes at C4-5 and C6-7 without evidence of critical spinal stenosis.  IMPRESSION: No acute or traumatic finding.  Chronic fusion of the spine from C3- T1.  Mild degenerative changes at the upper cervical region.   Original Report Authenticated By: Paulina Fusi, M.D.   Dg Chest Port 1 View  09/09/2012   *RADIOLOGY REPORT*  Clinical Data: Altered mental  status  PORTABLE CHEST - 1 VIEW  Comparison:  September 08, 2012  Findings: There is a small area of infiltrate in the left base. Elsewhere, lungs clear.  Heart is borderline enlarged with normal pulmonary vascularity.  No adenopathy.  No pneumothorax.  Aorta is prominent but stable.  IMPRESSION: Small area of infiltrate left base.  Borderline cardiomegaly.  Aortic prominence may reflect chronic hypertension.   Original Report Authenticated By: Bretta Bang, M.D.   Dg Chest Port 1 View  09/08/2012   *RADIOLOGY REPORT*  Clinical Data: Altered mental status  PORTABLE CHEST - 1 VIEW  Comparison: 08/28/2007  Findings: Borderline cardiomegaly.  No acute infiltrate or pleural effusion.  No pulmonary edema.  Atherosclerotic calcifications of thoracic aorta again noted.  IMPRESSION: No active disease.  Borderline cardiomegaly.   Original Report Authenticated By: Natasha Mead, M.D.    Microbiology: Recent Results (from the past 240 hour(s))  URINE CULTURE     Status: None   Collection Time    09/08/12  7:43 PM      Result Value Range Status   Specimen Description URINE, RANDOM   Final   Special  Requests NONE   Final   Culture  Setup Time     Final   Value: 09/09/2012 01:48     Performed at [facility] Count     Final   Value: NO GROWTH     Performed at [facility]   Culture     Final   Value: NO GROWTH     Performed at [facility]   Report Status 09/09/2012 FINAL   Final  MRSA PCR SCREENING     Status: None   Collection Time    09/08/12 11:17 PM      Result Value Range Status   MRSA by PCR NEGATIVE  NEGATIVE Final   Comment:            The GeneXpert MRSA Assay (FDA     approved for NASAL specimens     only), is one component of a     comprehensive MRSA colonization     surveillance program. It is not     intended to diagnose MRSA     infection nor to guide or     monitor treatment for     MRSA infections.  CULTURE, BLOOD (ROUTINE X 2)     Status: None   Collection Time    09/09/12  2:00 AM      Result Value Range Status   Specimen Description BLOOD RIGHT ARM   Final   Special Requests     Final   Value: BOTTLES DRAWN AEROBIC AND ANAEROBIC 10CC BLUE,5CC RED   Culture  Setup Time     Final   Value: 09/09/2012 09:07     Performed at [facility]   Culture     Final   Value:        BLOOD CULTURE RECEIVED NO GROWTH TO DATE CULTURE WILL BE HELD FOR 5 DAYS BEFORE ISSUING A FINAL NEGATIVE REPORT     Performed at [facility]   Report Status PENDING   Incomplete  CULTURE, BLOOD (ROUTINE X 2)     Status: None   Collection Time    09/09/12  2:15 AM      Result Value Range Status   Specimen Description BLOOD RIGHT HAND   Final   Special Requests BOTTLES DRAWN AEROBIC ONLY 5CC   Final  Culture  Setup Time     Final   Value: 09/09/2012 09:07     Performed at [facility]   Culture     Final   Value:        BLOOD CULTURE RECEIVED NO GROWTH TO DATE CULTURE WILL BE HELD FOR 5 DAYS BEFORE ISSUING A FINAL NEGATIVE REPORT     Performed at [facility]   Report Status PENDING   Incomplete     Labs: Basic  Metabolic Panel:  Recent Labs Lab 09/09/12 1134 09/09/12 2037 09/10/12 0919 09/11/12 0405 09/13/12 1018  NA 166* 158* 151* 149* 144  K 3.9 4.0 3.3* 3.5 3.4*  CL 130* 126* 118* 117* 109  CO2 24 20 24 23 25   GLUCOSE 135* 132* 112* 112* 111*  BUN 56* 47* 31* 16 6  CREATININE 1.09 0.86 0.84 0.72 0.66  CALCIUM 9.5 9.1 9.0 8.6 9.2  MG  --   --   --  2.3  --    Liver Function Tests:  Recent Labs Lab 09/08/12 1945  AST 61*  ALT 55*  ALKPHOS 96  BILITOT 0.4  PROT 8.9*  ALBUMIN 3.3*   No results found for this basename: LIPASE, AMYLASE,  in the last 168 hours No results found for this basename: AMMONIA,  in the last 168 hours CBC:  Recent Labs Lab 09/08/12 1945 09/09/12 0200 09/10/12 0919 09/13/12 1018  WBC 17.4* 18.4* 13.2* 8.8  NEUTROABS 15.5*  --   --   --   HGB 13.1 12.6 9.9* 10.6*  HCT 42.3 38.3 31.2* 32.4*  MCV 84.9 84.2 84.1 81.4  PLT 258 246 205 247   Cardiac Enzymes: No results found for this basename: CKTOTAL, CKMB, CKMBINDEX, TROPONINI,  in the last 168 hours BNP: BNP (last 3 results) No results found for this basename: PROBNP,  in the last 8760 hours CBG:  Recent Labs Lab 09/10/12 0436 09/10/12 0734 09/10/12 0746 09/10/12 1144 09/10/12 1558  GLUCAP 92 51* 77 104* 110*       Signed:  Jamesyn Lindell  [facility] 09/13/2012, 12:58 PM

## 2012-09-13 NOTE — Progress Notes (Signed)
Speech Language Pathology Dysphagia Treatment Patient Details Name: [name] MRN: [id number] DOB: Dec 27, 1922 Today's Date: 09/13/2012 Time: 1120-1130 SLP Time Calculation (min): 10 min  Assessment / Plan / Recommendation Clinical Impression  SLP offered trials of upgraded liquid texture for increased intake and improved hydration. Pt consumed 4 sips of nectar thick liquids without evidence of penetration or aspiration, though she did hold bolus for up to 15 seconds, responding to single verbal cue to swallow. Then pt refused further trials. Recommend upgrade to dys 1 (puree) with nectar thick liquids. Will check for tolerance and possible trials of thin if warranted.     Diet Recommendation  Initiate / Change Diet: Dysphagia 1 (puree);Nectar-thick liquid    SLP Plan Continue with current plan of care   Pertinent Vitals/Pain NA   Swallowing Goals  SLP Swallowing Goals Patient will consume recommended diet without observed clinical signs of aspiration with: Minimal cueing Swallow Study Goal #1 - Progress: Progressing toward goal Patient will utilize recommended strategies during swallow to increase swallowing safety with: Minimal cueing Swallow Study Goal #2 - Progress: Progressing toward goal  General Temperature Spikes Noted: No Respiratory Status: Room air Behavior/Cognition: Alert;Cooperative;Requires cueing;Other (comment) Oral Cavity - Dentition: Edentulous Patient Positioning: Upright in bed  Oral Cavity - Oral Hygiene     Dysphagia Treatment Treatment focused on: Skilled observation of diet tolerance;Upgraded PO texture trials Treatment Methods/Modalities: Skilled observation Patient observed directly with PO's: Yes Type of PO's observed: Nectar-thick liquids Feeding: Able to feed self;Needs assist Liquids provided via: Cup;Straw Oral Phase Signs & Symptoms: Prolonged oral phase (oral holding) Pharyngeal Phase Signs & Symptoms: Suspected delayed swallow  initiation Type of cueing: Verbal Amount of cueing: Minimal   GO    Harlon Ditty, MA CCC-SLP [phone]  Claudine Mouton 09/13/2012, 11:38 AM

## 2012-09-15 LAB — CULTURE, BLOOD (ROUTINE X 2): Culture: NO GROWTH

## 2012-12-20 ENCOUNTER — Emergency Department (HOSPITAL_COMMUNITY)
Admission: EM | Admit: 2012-12-20 | Discharge: 2012-12-20 | Disposition: A | Discharge status: Skilled Nursing Facility | Payer: Medicare Other | Attending: Emergency Medicine | Admitting: Emergency Medicine

## 2012-12-20 ENCOUNTER — Encounter (HOSPITAL_COMMUNITY): Payer: Self-pay | Admitting: Emergency Medicine

## 2012-12-20 DIAGNOSIS — Z79899 Other long term (current) drug therapy: Secondary | ICD-10-CM | POA: Insufficient documentation

## 2012-12-20 DIAGNOSIS — Y939 Activity, unspecified: Secondary | ICD-10-CM | POA: Insufficient documentation

## 2012-12-20 DIAGNOSIS — Y9210 Unspecified residential institution as the place of occurrence of the external cause: Secondary | ICD-10-CM | POA: Insufficient documentation

## 2012-12-20 DIAGNOSIS — Z86718 Personal history of other venous thrombosis and embolism: Secondary | ICD-10-CM | POA: Insufficient documentation

## 2012-12-20 DIAGNOSIS — S01312A Laceration without foreign body of left ear, initial encounter: Secondary | ICD-10-CM

## 2012-12-20 DIAGNOSIS — S01309A Unspecified open wound of unspecified ear, initial encounter: Secondary | ICD-10-CM | POA: Insufficient documentation

## 2012-12-20 DIAGNOSIS — F0390 Unspecified dementia without behavioral disturbance: Secondary | ICD-10-CM | POA: Insufficient documentation

## 2012-12-20 DIAGNOSIS — Z862 Personal history of diseases of the blood and blood-forming organs and certain disorders involving the immune mechanism: Secondary | ICD-10-CM | POA: Insufficient documentation

## 2012-12-20 DIAGNOSIS — W06XXXA Fall from bed, initial encounter: Secondary | ICD-10-CM | POA: Insufficient documentation

## 2012-12-20 NOTE — ED Notes (Signed)
Pt from [facility]. Staff reports pt rolled out of bed. No obvious injuries. Pt does have small abrasion inside of ear however. Pt a&o per norm. Vs stable.

## 2012-12-20 NOTE — ED Provider Notes (Signed)
CSN: [id number]     Arrival date & time 12/20/12  0419 History   First MD Initiated Contact with Patient 12/20/12 [phone]     Chief Complaint  Patient presents with  . Fall   (Consider location/radiation/quality/duration/timing/severity/associated sxs/prior Treatment) The history is provided by the patient and medical records. History limited by: Level V caveat: Dementia.   as reported that the patient rolled out of bed and injured her left ear with a small amount of bleeding.  No reported loss of consciousness.  The patient is not on anticoagulants.  She does have a history of dementia.  She denies any headache. she denies neck pain.  Denies weakness of her arms or legs.  No chest pain or abdominal pain.  Past Medical History  Diagnosis Date  . Dementia   . Dysphasia   . Anemia   . Embolism   . Thrombosis    History reviewed. No pertinent past surgical history. No family history on file. History  Substance Use Topics  . Smoking status: Never Smoker   . Smokeless tobacco: Not on file  . Alcohol Use: Not on file   OB History   Grav Para Term Preterm Abortions TAB SAB Ect Mult Living                 Review of Systems  Unable to perform ROS: Dementia    Allergies  Review of patient's allergies indicates no known allergies.  Home Medications   Current Outpatient Rx  Name  Route  Sig  Dispense  Refill  . acetaminophen (TYLENOL) 500 MG tablet   Oral   Take 500 mg by mouth every 6 (six) hours as needed for pain.         Marland Kitchen LORazepam (ATIVAN) 0.5 MG tablet   Oral   Take 0.5 mg by mouth every 8 (eight) hours as needed for anxiety.         . mirtazapine (REMERON) 15 MG tablet   Oral   Take 15 mg by mouth at bedtime.         . potassium chloride 20 MEQ/15ML (10%) solution   Oral   Take 15 mLs (20 mEq total) by mouth 2 (two) times daily.   500 mL   0   . PRESCRIPTION MEDICATION   Oral   Take 1 Can by mouth 4 (four) times daily. Mighty Shakes (4 oz)         .  sennosides-docusate sodium (SENOKOT-S) 8.6-50 MG tablet   Oral   Take 1 tablet by mouth at bedtime.         . Vitamin D, Ergocalciferol, (DRISDOL) 50000 UNITS CAPS capsule   Oral   Take 50,000 Units by mouth See admin instructions. Take 1 capsule every month on the 17th          BP 152/81  Pulse 68  Temp(Src) 97.9 F (36.6 C) (Oral)  Resp 14  SpO2 99% Physical Exam  Nursing note and vitals reviewed. Constitutional: She appears well-developed and well-nourished. No distress.  HENT:  Head: Normocephalic and atraumatic.  Eyes: EOM are normal.  Neck: Normal range of motion.  Cardiovascular: Normal rate, regular rhythm and normal heart sounds.   Pulmonary/Chest: Effort normal and breath sounds normal.  Abdominal: Soft. She exhibits no distension. There is no tenderness.  Musculoskeletal: Normal range of motion.  Neurological: She is alert.  Follows commands. 5 Out of 5 strength in bilateral upper and lower extremity major muscle groups.   Skin: Skin  is warm and dry.  Psychiatric: She has a normal mood and affect. Judgment normal.    ED Course  Procedures (including critical care time)  LACERATION REPAIR Performed by: Lyanne Co Consent: Verbal consent obtained. Risks and benefits: risks, benefits and alternatives were discussed Patient identity confirmed: provided demographic data Time out performed prior to procedure Prepped and Draped in normal sterile fashion Wound explored Laceration Location: Left ear antihelix Laceration Length: 1.5 cm No Foreign Bodies seen or palpated Anesthesia: none  Irrigation method: syringe Amount of cleaning: standard Skin closure: Tissue adhesive  Patient tolerance: Patient tolerated the procedure well with no immediate complications.   Labs Review Labs Reviewed - No data to display Imaging Review No results found.  EKG Interpretation   None       MDM   1. Laceration of left ear    Laceration repair with tissue  adhesive.  No indication for imaging of the head and neck.  Good strength in arms and legs.  We'll discharge home in good condition.    Lyanne Co, MD 12/20/12 [phone]

## 2012-12-20 NOTE — ED Notes (Signed)
[facility] paged for pt transport back to nursing facility.

## 2013-02-04 ENCOUNTER — Encounter (HOSPITAL_COMMUNITY): Payer: Self-pay | Admitting: Emergency Medicine

## 2013-02-04 ENCOUNTER — Emergency Department (HOSPITAL_COMMUNITY)
Admission: EM | Admit: 2013-02-04 | Discharge: 2013-02-04 | Disposition: A | Discharge status: Skilled Nursing Facility | Payer: Medicare Other | Attending: Emergency Medicine | Admitting: Emergency Medicine

## 2013-02-04 DIAGNOSIS — W1809XA Striking against other object with subsequent fall, initial encounter: Secondary | ICD-10-CM | POA: Insufficient documentation

## 2013-02-04 DIAGNOSIS — S0990XA Unspecified injury of head, initial encounter: Secondary | ICD-10-CM

## 2013-02-04 DIAGNOSIS — S99919A Unspecified injury of unspecified ankle, initial encounter: Secondary | ICD-10-CM

## 2013-02-04 DIAGNOSIS — Y939 Activity, unspecified: Secondary | ICD-10-CM | POA: Insufficient documentation

## 2013-02-04 DIAGNOSIS — S8990XA Unspecified injury of unspecified lower leg, initial encounter: Secondary | ICD-10-CM | POA: Insufficient documentation

## 2013-02-04 DIAGNOSIS — F0390 Unspecified dementia without behavioral disturbance: Secondary | ICD-10-CM | POA: Insufficient documentation

## 2013-02-04 DIAGNOSIS — W19XXXA Unspecified fall, initial encounter: Secondary | ICD-10-CM

## 2013-02-04 DIAGNOSIS — Z8679 Personal history of other diseases of the circulatory system: Secondary | ICD-10-CM | POA: Insufficient documentation

## 2013-02-04 DIAGNOSIS — Y92129 Unspecified place in nursing home as the place of occurrence of the external cause: Secondary | ICD-10-CM

## 2013-02-04 DIAGNOSIS — Z79899 Other long term (current) drug therapy: Secondary | ICD-10-CM | POA: Insufficient documentation

## 2013-02-04 DIAGNOSIS — Z862 Personal history of diseases of the blood and blood-forming organs and certain disorders involving the immune mechanism: Secondary | ICD-10-CM | POA: Insufficient documentation

## 2013-02-04 DIAGNOSIS — S99929A Unspecified injury of unspecified foot, initial encounter: Secondary | ICD-10-CM

## 2013-02-04 DIAGNOSIS — Y9210 Unspecified residential institution as the place of occurrence of the external cause: Secondary | ICD-10-CM | POA: Insufficient documentation

## 2013-02-04 NOTE — ED Notes (Signed)
[facility] called by Diplomatic Services operational officersecretary, Joice LoftsAmber C.

## 2013-02-04 NOTE — ED Provider Notes (Signed)
CSN: [id number]     Arrival date & time 02/04/13  1653 History   First MD Initiated Contact with Patient 02/04/13 1735     Chief Complaint  Patient presents with  . Fall   (Consider location/radiation/quality/duration/timing/severity/associated sxs/prior Treatment) HPI Comments: Pt with dementia, right AKA, pt forgets that she had an AKA and tries to stand sometimes which she did today.  She then fell and hit her head.  Per staff, no LOC, has been her usual baseline which is confused, forgetful but conversant.  Pt here denies HA,neck pain.  She complains of her right leg hurting and she seems unaware that she either fell and that she has had an AKA in the past which is her baseline.  She denies CP, SOB.  No back pain.  She is not on anticoagulation.  Level 5 caveat due to dementia  Patient is a 80 y.o. female presenting with fall. The history is provided by the patient and the nursing home.  Fall This is a new problem.    Past Medical History  Diagnosis Date  . Dementia   . Dysphasia   . Anemia   . Embolism   . Thrombosis    History reviewed. No pertinent past surgical history. History reviewed. No pertinent family history. History  Substance Use Topics  . Smoking status: Never Smoker   . Smokeless tobacco: Not on file  . Alcohol Use: Not on file   OB History   Grav Para Term Preterm Abortions TAB SAB Ect Mult Living                 Review of Systems  Unable to perform ROS: Dementia    Allergies  Review of patient's allergies indicates no known allergies.  Home Medications   Current Outpatient Rx  Name  Route  Sig  Dispense  Refill  . alum & mag hydroxide-simeth (MAALOX/MYLANTA) 200-200-20 MG/5ML suspension   Oral   Take 30 mLs by mouth every 6 (six) hours as needed for indigestion or heartburn.         Marland Kitchen. guaiFENesin (ROBITUSSIN) 100 MG/5ML SOLN   Oral   Take 10 mLs by mouth every 4 (four) hours as needed for cough or to loosen phlegm.         . latanoprost  (XALATAN) 0.005 % ophthalmic solution   Left Eye   Place 1 drop into the left eye at bedtime.         Marland Kitchen. loperamide (ANTI-DIARRHEAL) 2 MG capsule   Oral   Take 2 mg by mouth as needed for diarrhea or loose stools.         . magnesium hydroxide (MILK OF MAGNESIA) 400 MG/5ML suspension   Oral   Take 30 mLs by mouth at bedtime as needed for mild constipation.         . mirtazapine (REMERON) 15 MG tablet   Oral   Take 15 mg by mouth at bedtime.         . potassium chloride 20 MEQ/15ML (10%) solution   Oral   Take 15 mLs (20 mEq total) by mouth 2 (two) times daily.   500 mL   0   . PRESCRIPTION MEDICATION   Oral   Take 1 Can by mouth 4 (four) times daily. Mighty Shakes (4 oz)         . sennosides-docusate sodium (SENOKOT-S) 8.6-50 MG tablet   Oral   Take 1 tablet by mouth at bedtime.         .Marland Kitchen  acetaminophen (TYLENOL) 500 MG tablet   Oral   Take 500 mg by mouth every 6 (six) hours as needed for pain.         Marland Kitchen LORazepam (ATIVAN) 0.5 MG tablet   Oral   Take 0.5 mg by mouth every 8 (eight) hours as needed for anxiety.         . Vitamin D, Ergocalciferol, (DRISDOL) 50000 UNITS CAPS capsule   Oral   Take 50,000 Units by mouth See admin instructions. Take 1 capsule every month on the 17th          BP 126/68  Pulse 73  Temp(Src) 98.4 F (36.9 C) (Oral)  Resp 16  SpO2 100% Physical Exam  Nursing note and vitals reviewed. Constitutional: She appears well-developed and well-nourished. No distress.  HENT:  Head: Normocephalic and atraumatic.  Eyes: EOM are normal.  Neck: Normal range of motion. No spinous process tenderness and no muscular tenderness present. Normal range of motion present. No Brudzinski's sign and no Kernig's sign noted.  Cardiovascular: Normal rate, regular rhythm and intact distal pulses.   Pulmonary/Chest: Effort normal. No respiratory distress. She has no wheezes.  Abdominal: Soft. She exhibits no distension. There is no tenderness.  There is no rebound.  Musculoskeletal:  AKA present, healed, no bruises, bleeding or wounds found  Neurological: She is alert. She exhibits normal muscle tone. Coordination normal.  Skin: Skin is warm and dry. She is not diaphoretic.    ED Course  Procedures (including critical care time) Labs Review Labs Reviewed - No data to display Imaging Review No results found.  EKG Interpretation   None       MDM   1. Fall at nursing home   2. Minor head injury without loss of consciousness      Pt shows no obvious external injuries, was witnessed.  No LOc there.  Pt is not on coumadin.  I spoke to staff, they understand to monitor pt carefully with usual head injury precautions.      Gavin Pound. Oletta Lamas, MD 02/04/13 0454

## 2013-02-04 NOTE — ED Notes (Signed)
Bed: WA09 Expected date:  Expected time:  Means of arrival:  Comments: 

## 2013-02-04 NOTE — ED Notes (Signed)
[facility] EMS [facility] unwitnessed [facility]. Staff from facility believe she feel out of wheelchair. Pt has hx of dementia only a/o to self. Pt denies pain or injury although pt does not remember fall. Pt has hx of right eye blindness and stays shut.

## 2013-02-07 ENCOUNTER — Emergency Department (HOSPITAL_COMMUNITY): Payer: Medicare Other

## 2013-02-07 ENCOUNTER — Emergency Department (HOSPITAL_COMMUNITY)
Admission: EM | Admit: 2013-02-07 | Discharge: 2013-02-07 | Disposition: A | Discharge status: Skilled Nursing Facility | Payer: Medicare Other | Attending: Emergency Medicine | Admitting: Emergency Medicine

## 2013-02-07 ENCOUNTER — Encounter (HOSPITAL_COMMUNITY): Payer: Self-pay | Admitting: Emergency Medicine

## 2013-02-07 DIAGNOSIS — S8990XA Unspecified injury of unspecified lower leg, initial encounter: Secondary | ICD-10-CM | POA: Insufficient documentation

## 2013-02-07 DIAGNOSIS — Z862 Personal history of diseases of the blood and blood-forming organs and certain disorders involving the immune mechanism: Secondary | ICD-10-CM | POA: Insufficient documentation

## 2013-02-07 DIAGNOSIS — S99929A Unspecified injury of unspecified foot, initial encounter: Principal | ICD-10-CM

## 2013-02-07 DIAGNOSIS — S79919A Unspecified injury of unspecified hip, initial encounter: Secondary | ICD-10-CM | POA: Insufficient documentation

## 2013-02-07 DIAGNOSIS — W19XXXA Unspecified fall, initial encounter: Secondary | ICD-10-CM

## 2013-02-07 DIAGNOSIS — M79606 Pain in leg, unspecified: Secondary | ICD-10-CM

## 2013-02-07 DIAGNOSIS — W050XXA Fall from non-moving wheelchair, initial encounter: Secondary | ICD-10-CM | POA: Insufficient documentation

## 2013-02-07 DIAGNOSIS — F0390 Unspecified dementia without behavioral disturbance: Secondary | ICD-10-CM | POA: Insufficient documentation

## 2013-02-07 DIAGNOSIS — Y9389 Activity, other specified: Secondary | ICD-10-CM | POA: Insufficient documentation

## 2013-02-07 DIAGNOSIS — Z79899 Other long term (current) drug therapy: Secondary | ICD-10-CM | POA: Insufficient documentation

## 2013-02-07 DIAGNOSIS — Z86718 Personal history of other venous thrombosis and embolism: Secondary | ICD-10-CM | POA: Insufficient documentation

## 2013-02-07 DIAGNOSIS — Y9210 Unspecified residential institution as the place of occurrence of the external cause: Secondary | ICD-10-CM | POA: Insufficient documentation

## 2013-02-07 DIAGNOSIS — S79929A Unspecified injury of unspecified thigh, initial encounter: Secondary | ICD-10-CM

## 2013-02-07 DIAGNOSIS — S99919A Unspecified injury of unspecified ankle, initial encounter: Principal | ICD-10-CM

## 2013-02-07 DIAGNOSIS — Z86711 Personal history of pulmonary embolism: Secondary | ICD-10-CM | POA: Insufficient documentation

## 2013-02-07 DIAGNOSIS — S0990XA Unspecified injury of head, initial encounter: Secondary | ICD-10-CM | POA: Insufficient documentation

## 2013-02-07 NOTE — ED Notes (Signed)
Patient transported to X-ray 

## 2013-02-07 NOTE — ED Notes (Signed)
Bed: WA06 Expected date:  Expected time:  Means of arrival:  Comments: EMS- elderly, fell out of wheelchair

## 2013-02-07 NOTE — ED Notes (Signed)
Patient transported to CT 

## 2013-02-07 NOTE — Discharge Instructions (Signed)
Return to the ED with any concerns including vomiting, seizure activity, decreased mental status, or any other alarming symptoms °

## 2013-02-07 NOTE — ED Notes (Signed)
[facility]. Pt had witnessed fall out of w/c, bending over to pick something up. Hit head, no LOC. Pt has R AKA. C/o pain at stump. Denies any other pain. No injury/swelling to head.

## 2013-02-07 NOTE — ED Notes (Signed)
Patient returned from X-ray 

## 2013-02-07 NOTE — ED Notes (Signed)
[facility]. [facility] contacted for transport back to facility.

## 2013-02-07 NOTE — ED Provider Notes (Signed)
CSN: [id number]     Arrival date & time 02/07/13  1139 History   First MD Initiated Contact with Patient 02/07/13 1208     Chief Complaint  Patient presents with  . Fall  . Leg Pain   (Consider location/radiation/quality/duration/timing/severity/associated sxs/prior Treatment) HPI A LEVEL 5 CAVEAT PERTAINS DUE TO DEMENTIA Pt presents from nursing facility after witnessed fall.  Per report Pt fell from wheelchair while leaning forward to pick something up. Pt c/o pain in right thigh.  Per report she hit her head, no LOC.    Past Medical History  Diagnosis Date  . Dementia   . Dysphasia   . Anemia   . Embolism   . Thrombosis   . Dysphasia    History reviewed. No pertinent past surgical history. No family history on file. History  Substance Use Topics  . Smoking status: Never Smoker   . Smokeless tobacco: Not on file  . Alcohol Use: Not on file   OB History   Grav Para Term Preterm Abortions TAB SAB Ect Mult Living                 Review of Systems UNABLE TO OBTAIN ROS DUE TO LEVEL 5 CAVEAT  Allergies  Review of patient's allergies indicates no known allergies.  Home Medications   Current Outpatient Rx  Name  Route  Sig  Dispense  Refill  . latanoprost (XALATAN) 0.005 % ophthalmic solution   Left Eye   Place 1 drop into the left eye at bedtime.         . mirtazapine (REMERON) 15 MG tablet   Oral   Take 15 mg by mouth at bedtime.         . potassium chloride 20 MEQ/15ML (10%) solution   Oral   Take 15 mLs (20 mEq total) by mouth 2 (two) times daily.   500 mL   0   . PRESCRIPTION MEDICATION   Oral   Take 1 Can by mouth 4 (four) times daily. Mighty Shakes (4 oz)         . sennosides-docusate sodium (SENOKOT-S) 8.6-50 MG tablet   Oral   Take 1 tablet by mouth at bedtime.         Marland Kitchen acetaminophen (TYLENOL) 500 MG tablet   Oral   Take 500 mg by mouth every 6 (six) hours as needed for pain.         Marland Kitchen alum & mag hydroxide-simeth (MAALOX/MYLANTA)  200-200-20 MG/5ML suspension   Oral   Take 30 mLs by mouth every 6 (six) hours as needed for indigestion or heartburn.         Marland Kitchen guaiFENesin (ROBITUSSIN) 100 MG/5ML SOLN   Oral   Take 10 mLs by mouth every 4 (four) hours as needed for cough or to loosen phlegm.         . loperamide (ANTI-DIARRHEAL) 2 MG capsule   Oral   Take 2 mg by mouth as needed for diarrhea or loose stools.         Marland Kitchen LORazepam (ATIVAN) 0.5 MG tablet   Oral   Take 0.5 mg by mouth every 8 (eight) hours as needed for anxiety.         . magnesium hydroxide (MILK OF MAGNESIA) 400 MG/5ML suspension   Oral   Take 30 mLs by mouth at bedtime as needed for mild constipation.         . Vitamin D, Ergocalciferol, (DRISDOL) 50000 UNITS CAPS capsule   Oral  Take 50,000 Units by mouth See admin instructions. Take 1 capsule every month on the 17th          BP 139/61  Pulse 73  Temp(Src) 98.1 F (36.7 C) (Oral)  Resp 16  SpO2 100% Vitals reviewed Physical Exam Physical Examination: General appearance - alert, well appearing, and in no distress Mental status - alert, oriented to person only Head- NCAT Eyes - pupils equal and reactive, extraocular eye movements intact Mouth - mucous membranes moist, pharynx normal without lesions Chest - clear to auscultation, no wheezes, rales or rhonchi, symmetric air entry Heart - normal rate, regular rhythm, normal S1, S2, no murmurs, rubs, clicks or gallops Abdomen - soft, nontender, nondistended, no masses or organomegaly Musculoskeletal - s/p right AKA, ttp in mid/distal right thigh, otherwise no joint tenderness, deformity or swelling Extremities - peripheral pulses normal, no pedal edema, no clubbing or cyanosis Skin - normal coloration and turgor, no rashes  ED Course  Procedures (including critical care time) Labs Review Labs Reviewed - No data to display Imaging Review Dg Femur Right  02/07/2013   CLINICAL DATA:  Fall.  Right femur pain.  Right lower  extremity NGT.  EXAM: RIGHT FEMUR - 2 VIEW  COMPARISON:  None.  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 There is no evidence of fracture or other focal bone lesions. No evidence of osteolysis or acute periostitis. Generalized osteopenia noted. Mild right hip osteoarthritis also noted.  IMPRESSION: No radiographic evidence of fracture or other acute findings.   Electronically Signed   By: Myles RosenthalJohn  Stahl M.D.   On: 02/07/2013 13:17   Ct Head Wo Contrast  02/07/2013   CLINICAL DATA:  Fall.  Blunt head injury.  EXAM: CT HEAD WITHOUT CONTRAST  TECHNIQUE: Contiguous axial images were obtained from the base of the skull through the vertex without intravenous contrast.  COMPARISON:  09/08/12  FINDINGS: There is no evidence of intracranial hemorrhage, brain edema, or other signs of acute infarction. There is no evidence of intracranial mass lesion or mass effect. No abnormal extraaxial fluid collections are identified. Previously seen intracranial hemorrhage has resolved.  Mild to moderate diffuse cerebral atrophy and chronic small vessel disease are stable in appearance. Ventricles are stable size. No evidence of skull fracture.  IMPRESSION: No acute intracranial findings   Electronically Signed   By: Myles RosenthalJohn  Stahl M.D.   On: 02/07/2013 13:39    EKG Interpretation   None       MDM   1. Fall   2. Head injury   3. Lower extremity pain    Pt presenting after what sounds by report like a mechanical fall.  Pt has pain at site of prior AKA on right and mid right thigh.  Xray reassuring.  Also struck her head, no signs of significant head injury but she is not able to contribute to history, so head CT obtained.  No signs of significant injury.  Discharged with strict return precautions.    Ethelda ChickMartha K Linker, MD 02/07/13 [phone]

## 2013-03-05 ENCOUNTER — Encounter (HOSPITAL_COMMUNITY): Payer: Self-pay | Admitting: Emergency Medicine

## 2013-03-05 ENCOUNTER — Emergency Department (HOSPITAL_COMMUNITY): Payer: Medicare Other

## 2013-03-05 ENCOUNTER — Emergency Department (HOSPITAL_COMMUNITY)
Admission: EM | Admit: 2013-03-05 | Discharge: 2013-03-05 | Disposition: A | Discharge status: Home / Self Care | Payer: Medicare Other | Attending: Emergency Medicine | Admitting: Emergency Medicine

## 2013-03-05 DIAGNOSIS — S82409A Unspecified fracture of shaft of unspecified fibula, initial encounter for closed fracture: Principal | ICD-10-CM

## 2013-03-05 DIAGNOSIS — Z8679 Personal history of other diseases of the circulatory system: Secondary | ICD-10-CM | POA: Insufficient documentation

## 2013-03-05 DIAGNOSIS — Y939 Activity, unspecified: Secondary | ICD-10-CM | POA: Insufficient documentation

## 2013-03-05 DIAGNOSIS — X58XXXA Exposure to other specified factors, initial encounter: Secondary | ICD-10-CM | POA: Insufficient documentation

## 2013-03-05 DIAGNOSIS — Z862 Personal history of diseases of the blood and blood-forming organs and certain disorders involving the immune mechanism: Secondary | ICD-10-CM | POA: Insufficient documentation

## 2013-03-05 DIAGNOSIS — Y929 Unspecified place or not applicable: Secondary | ICD-10-CM | POA: Insufficient documentation

## 2013-03-05 DIAGNOSIS — S78119A Complete traumatic amputation at level between unspecified hip and knee, initial encounter: Secondary | ICD-10-CM | POA: Insufficient documentation

## 2013-03-05 DIAGNOSIS — Z79899 Other long term (current) drug therapy: Secondary | ICD-10-CM | POA: Insufficient documentation

## 2013-03-05 DIAGNOSIS — F0390 Unspecified dementia without behavioral disturbance: Secondary | ICD-10-CM | POA: Insufficient documentation

## 2013-03-05 DIAGNOSIS — S82209A Unspecified fracture of shaft of unspecified tibia, initial encounter for closed fracture: Secondary | ICD-10-CM | POA: Insufficient documentation

## 2013-03-05 MED ORDER — HYDROCODONE-ACETAMINOPHEN 5-325 MG PO TABS
1.0000 | ORAL_TABLET | Freq: Once | ORAL | Status: AC
  Administered 2013-03-05: 1 via ORAL
  Filled 2013-03-05: qty 1

## 2013-03-05 MED ORDER — HYDROCODONE-ACETAMINOPHEN 5-325 MG PO TABS: 1.0000 | ORAL_TABLET | Freq: Four times a day (QID) | ORAL | Status: DC | PRN

## 2013-03-05 NOTE — ED Notes (Signed)
Patient transported to X-ray 

## 2013-03-05 NOTE — ED Notes (Signed)
Report given to [facility] 

## 2013-03-05 NOTE — ED Notes (Signed)
Bed: WA07 Expected date:  Expected time:  Means of arrival:  Comments: EMS short of breath 

## 2013-03-05 NOTE — Discharge Instructions (Signed)
Cast or Splint Care Casts and splints support injured limbs and keep bones from moving while they heal. It is important to care for your cast or splint at home.  HOME CARE INSTRUCTIONS  Keep the cast or splint uncovered during the drying period. It can take 24 to 48 hours to dry if it is made of plaster. A fiberglass cast will dry in less than 1 hour.  Do not rest the cast on anything harder than a pillow for the first 24 hours.  Do not put weight on your injured limb or apply pressure to the cast until your health care provider gives you permission.  Keep the cast or splint dry. Wet casts or splints can lose their shape and may not support the limb as well. A wet cast that has lost its shape can also create harmful pressure on your skin when it dries. Also, wet skin can become infected.  Cover the cast or splint with a plastic bag when bathing or when out in the rain or snow. If the cast is on the trunk of the body, take sponge baths until the cast is removed.  If your cast does become wet, dry it with a towel or a blow dryer on the cool setting only.  Keep your cast or splint clean. Soiled casts may be wiped with a moistened cloth.  Do not place any hard or soft foreign objects under your cast or splint, such as cotton, toilet paper, lotion, or powder.  Do not try to scratch the skin under the cast with any object. The object could get stuck inside the cast. Also, scratching could lead to an infection. If itching is a problem, use a blow dryer on a cool setting to relieve discomfort.  Do not trim or cut your cast or remove padding from inside of it.  Exercise all joints next to the injury that are not immobilized by the cast or splint. For example, if you have a long leg cast, exercise the hip joint and toes. If you have an arm cast or splint, exercise the shoulder, elbow, thumb, and fingers.  Elevate your injured arm or leg on 1 or 2 pillows for the first 1 to 3 days to decrease  swelling and pain.It is best if you can comfortably elevate your cast so it is higher than your heart. SEEK MEDICAL CARE IF:   Your cast or splint cracks.  Your cast or splint is too tight or too loose.  You have unbearable itching inside the cast.  Your cast becomes wet or develops a soft spot or area.  You have a bad smell coming from inside your cast.  You get an object stuck under your cast.  Your skin around the cast becomes red or raw.  You have new pain or worsening pain after the cast has been applied. SEEK IMMEDIATE MEDICAL CARE IF:   You have fluid leaking through the cast.  You are unable to move your fingers or toes.  You have discolored (blue or white), cool, painful, or very swollen fingers or toes beyond the cast.  You have tingling or numbness around the injured area.  You have severe pain or pressure under the cast.  You have any difficulty with your breathing or have shortness of breath.  You have chest pain. Document Released: 01/04/2000 Document Revised: 10/27/2012 Document Reviewed: 07/15/2012 [facility] Patient Information 2014 [place], [place]. Tibial Fracture, Adult You have a fracture (break in bone) of your tibia. This is  the large "shin" bone in your lower leg. These fractures are easily diagnosed with x-rays. TREATMENT  You have a simple fracture which usually will heal without disability. It can be treated with simple immobilization. This means the bone can be held with a cast or splint in a favorable position until your caregiver feels it is stable (healed well enough). Then you can begin range of motion exercises to keep your knee and ankle limber (moving well). HOME CARE INSTRUCTIONS   Apply ice to the injury for 15-20 minutes, 03-04 times per day while awake, for 2 days. Put the ice in a plastic bag and place a thin towel between the bag of ice and your cast.  If you have a plaster or fiberglass cast:  Do not try to scratch the skin under  the cast using sharp or pointed objects.  Check the skin around the cast every day. You may put lotion on any red or sore areas.  Keep your cast dry and clean.  If you have a plaster splint:  Wear the splint as directed.  You may loosen the elastic around the splint if your toes become numb, tingle, or turn cold or blue.  Do not put pressure on any part of your cast or splint until it is fully hardened.  Your cast or splint can be protected during bathing with a plastic bag. Do not lower the cast or splint into water.  Use crutches as directed.  Only take over-the-counter or prescription medicines for pain, discomfort, or fever as directed by your caregiver.  See your caregiver as directed. It is very important to keep all follow-up referrals and appointments in order to avoid any long-term problems with your leg and ankle including chronic pain, inability to move the ankle normally, failure of the fracture to heal and permanent disability. SEEK IMMEDIATE MEDICAL CARE IF:   Pain is becoming worse rather than better, or if pain is uncontrolled with medications.  You have increased swelling, pain, or redness in the foot.  You begin to lose feeling in your foot or toes.  You develop a cold or blue foot or toes on the injured side.  You develop severe pain in your injured leg, especially if it is increased with movement of your toes. Document Released: 10/01/2000 Document Revised: 03/31/2011 Document Reviewed: 04/25/2008 [facility] Patient Information 2014 [place].

## 2013-03-05 NOTE — ED Notes (Signed)
[facility].  Per ems report: pt started having pain to LT leg this am.  Hx RAKA.  Normally will scoot around in her w/c w/her LT leg but she wasn't doing so this am. Staff noted swelling to the LT foot/leg area.  Pt was c/o pain to ems w/movement.  VSS: 158/80, 72 irregular, 18.

## 2013-03-05 NOTE — ED Provider Notes (Signed)
CSN: [id number]     Arrival date & time 03/05/13  1333 History   First MD Initiated Contact with Patient 03/05/13 1345     Chief Complaint  Patient presents with  . Leg Pain     (Consider location/radiation/quality/duration/timing/severity/associated sxs/prior Treatment) Patient is a 80 y.o. female presenting with leg pain. The history is provided by the patient. No language interpreter was used.  Leg Pain Location:  Leg and foot Injury: no   Leg location:  L leg Associated symptoms: no fever   Associated symptoms comment:  She is here from SNF with report of left leg pain without injury or fall today. She is using the leg less than her usual. She has had multiple recent falls but no concern for left leg pain until today. She has a history of right AKA secondary to PVD (per chart).    Past Medical History  Diagnosis Date  . Dementia   . Dysphasia   . Anemia   . Embolism   . Thrombosis   . Dysphasia    No past surgical history on file. History reviewed. No pertinent family history. History  Substance Use Topics  . Smoking status: Never Smoker   . Smokeless tobacco: Not on file  . Alcohol Use: No   OB History   Grav Para Term Preterm Abortions TAB SAB Ect Mult Living                 Review of Systems  Constitutional: Negative for fever and chills.  Gastrointestinal: Negative.   Genitourinary: Negative.   Musculoskeletal:       See HPI.  Skin: Negative for wound.      Allergies  Review of patient's allergies indicates no known allergies.  Home Medications   Current Outpatient Rx  Name  Route  Sig  Dispense  Refill  . latanoprost (XALATAN) 0.005 % ophthalmic solution   Left Eye   Place 1 drop into the left eye at bedtime.         . mirtazapine (REMERON) 15 MG tablet   Oral   Take 15 mg by mouth at bedtime.         . potassium chloride 20 MEQ/15ML (10%) solution   Oral   Take 15 mLs (20 mEq total) by mouth 2 (two) times daily.   500 mL   0   .  PRESCRIPTION MEDICATION   Oral   Take 1 Can by mouth 4 (four) times daily. Mighty Shakes (4 oz)         . sennosides-docusate sodium (SENOKOT-S) 8.6-50 MG tablet   Oral   Take 1 tablet by mouth at bedtime.         . Vitamin D, Ergocalciferol, (DRISDOL) 50000 UNITS CAPS capsule   Oral   Take 50,000 Units by mouth See admin instructions. Take 1 capsule every month on the 17th         . acetaminophen (TYLENOL) 500 MG tablet   Oral   Take 500 mg by mouth every 6 (six) hours as needed for pain.         Marland Kitchen. alum & mag hydroxide-simeth (MAALOX/MYLANTA) 200-200-20 MG/5ML suspension   Oral   Take 30 mLs by mouth every 6 (six) hours as needed for indigestion or heartburn.         Marland Kitchen. guaiFENesin (ROBITUSSIN) 100 MG/5ML SOLN   Oral   Take 10 mLs by mouth every 4 (four) hours as needed for cough or to loosen phlegm.         .Marland Kitchen  loperamide (ANTI-DIARRHEAL) 2 MG capsule   Oral   Take 2 mg by mouth as needed for diarrhea or loose stools.         Marland Kitchen LORazepam (ATIVAN) 0.5 MG tablet   Oral   Take 0.5 mg by mouth every 8 (eight) hours as needed for anxiety.         . magnesium hydroxide (MILK OF MAGNESIA) 400 MG/5ML suspension   Oral   Take 30 mLs by mouth at bedtime as needed for mild constipation.          BP 163/69  Pulse 83  Temp(Src) 98.5 F (36.9 C) (Oral)  Resp 16  SpO2 98% Physical Exam  Constitutional: She is oriented to person, place, and time. She appears well-developed and well-nourished. No distress.  HENT:  Head: Normocephalic and atraumatic.  Neck: Normal range of motion.  Pulmonary/Chest: Effort normal.  Abdominal: Soft. There is no tenderness. There is no rebound.  Musculoskeletal:  Right AKA. Left LE tenderness at foot, ankle, lower leg, knee and thigh to palpation. Foot and ankle are moderately swollen without redness or warmth. Obvious pain with any movement of the leg. No hip tenderness.   Neurological: She is alert and oriented to person, place, and  time.  Skin: Skin is warm and dry.    ED Course  Procedures (including critical care time) Labs Review Labs Reviewed - No data to display Imaging Review No results found.  EKG Interpretation   None       MDM   Final diagnoses:  None    1.Left Tib/fib fracture  2:30:  Patient is unreliable historian with history of dementia. Unable to pinpoint pain to single location. X-rays ordered of entire LE. If negative, given her poor vascular history requiring amputation, will obtain doppler study.   3:30:  ND tib/fib fracture, midshaft. Cast applied. No other injury. Doubt vascular compromise given palpable pulses distally and no discoloration.   Arnoldo Hooker, PA-C 03/05/13 1539

## 2013-03-05 NOTE — ED Notes (Signed)
[facility] called  

## 2013-03-10 NOTE — ED Provider Notes (Signed)
Medical screening examination/treatment/procedure(s) were performed by non-physician practitioner and as supervising physician I was immediately available for consultation/collaboration.  EKG Interpretation   None         Maisie Hauser, MD 03/10/13 0752 

## 2013-03-17 ENCOUNTER — Emergency Department (HOSPITAL_COMMUNITY): Payer: Medicare Other

## 2013-03-17 ENCOUNTER — Encounter (HOSPITAL_COMMUNITY): Payer: Self-pay | Admitting: Emergency Medicine

## 2013-03-17 ENCOUNTER — Inpatient Hospital Stay (HOSPITAL_COMMUNITY)
Admission: EM | Admit: 2013-03-17 | Discharge: 2013-03-25 | DRG: 640 | Disposition: A | Discharge status: Hospice | Payer: Medicare Other | Attending: Internal Medicine | Admitting: Internal Medicine

## 2013-03-17 DIAGNOSIS — D509 Iron deficiency anemia, unspecified: Secondary | ICD-10-CM | POA: Diagnosis present

## 2013-03-17 DIAGNOSIS — R630 Anorexia: Secondary | ICD-10-CM | POA: Diagnosis present

## 2013-03-17 DIAGNOSIS — Z681 Body mass index (BMI) 19 or less, adult: Secondary | ICD-10-CM

## 2013-03-17 DIAGNOSIS — R64 Cachexia: Secondary | ICD-10-CM | POA: Diagnosis present

## 2013-03-17 DIAGNOSIS — Z79899 Other long term (current) drug therapy: Secondary | ICD-10-CM

## 2013-03-17 DIAGNOSIS — R000 Tachycardia, unspecified: Secondary | ICD-10-CM | POA: Diagnosis present

## 2013-03-17 DIAGNOSIS — N179 Acute kidney failure, unspecified: Secondary | ICD-10-CM | POA: Diagnosis present

## 2013-03-17 DIAGNOSIS — E870 Hyperosmolality and hypernatremia: Secondary | ICD-10-CM | POA: Diagnosis present

## 2013-03-17 DIAGNOSIS — E41 Nutritional marasmus: Secondary | ICD-10-CM | POA: Diagnosis present

## 2013-03-17 DIAGNOSIS — F29 Unspecified psychosis not due to a substance or known physiological condition: Secondary | ICD-10-CM | POA: Diagnosis not present

## 2013-03-17 DIAGNOSIS — N390 Urinary tract infection, site not specified: Secondary | ICD-10-CM | POA: Diagnosis present

## 2013-03-17 DIAGNOSIS — R4789 Other speech disturbances: Secondary | ICD-10-CM | POA: Diagnosis present

## 2013-03-17 DIAGNOSIS — R4182 Altered mental status, unspecified: Secondary | ICD-10-CM

## 2013-03-17 DIAGNOSIS — E860 Dehydration: Secondary | ICD-10-CM

## 2013-03-17 DIAGNOSIS — F0390 Unspecified dementia without behavioral disturbance: Secondary | ICD-10-CM | POA: Diagnosis present

## 2013-03-17 DIAGNOSIS — S78119A Complete traumatic amputation at level between unspecified hip and knee, initial encounter: Secondary | ICD-10-CM

## 2013-03-17 DIAGNOSIS — Z7982 Long term (current) use of aspirin: Secondary | ICD-10-CM

## 2013-03-17 DIAGNOSIS — G9340 Encephalopathy, unspecified: Secondary | ICD-10-CM | POA: Diagnosis present

## 2013-03-17 DIAGNOSIS — R4701 Aphasia: Secondary | ICD-10-CM | POA: Diagnosis present

## 2013-03-17 DIAGNOSIS — J4489 Other specified chronic obstructive pulmonary disease: Secondary | ICD-10-CM | POA: Diagnosis present

## 2013-03-17 DIAGNOSIS — Z993 Dependence on wheelchair: Secondary | ICD-10-CM

## 2013-03-17 DIAGNOSIS — Z515 Encounter for palliative care: Secondary | ICD-10-CM

## 2013-03-17 DIAGNOSIS — R627 Adult failure to thrive: Secondary | ICD-10-CM | POA: Diagnosis present

## 2013-03-17 DIAGNOSIS — D72829 Elevated white blood cell count, unspecified: Secondary | ICD-10-CM | POA: Diagnosis present

## 2013-03-17 DIAGNOSIS — Z66 Do not resuscitate: Secondary | ICD-10-CM | POA: Diagnosis present

## 2013-03-17 DIAGNOSIS — J449 Chronic obstructive pulmonary disease, unspecified: Secondary | ICD-10-CM | POA: Diagnosis present

## 2013-03-17 LAB — BASIC METABOLIC PANEL
BUN: 99 mg/dL — AB (ref 6–23)
BUN: 99 mg/dL — ABNORMAL HIGH (ref 6–23)
CALCIUM: 8.9 mg/dL (ref 8.4–10.5)
CALCIUM: 8.9 mg/dL (ref 8.4–10.5)
CO2: 20 mEq/L (ref 19–32)
CO2: 19 meq/L (ref 19–32)
CREATININE: 1.53 mg/dL — AB (ref 0.50–1.10)
Chloride: >130 mEq/L (ref 96–112)
Creatinine, Ser: 1.51 mg/dL — ABNORMAL HIGH (ref 0.50–1.10)
GFR calc Af Amer: 34 mL/min — ABNORMAL LOW (ref >90)
GFR calc non Af Amer: 30 mL/min — ABNORMAL LOW (ref >90)
GFR, EST AFRICAN AMERICAN: 34 mL/min — AB (ref >90)
GFR, EST NON AFRICAN AMERICAN: 29 mL/min — AB (ref >90)
GLUCOSE: 190 mg/dL — AB (ref 70–99)
Glucose, Bld: 155 mg/dL — ABNORMAL HIGH (ref 70–99)
POTASSIUM: 4.6 meq/L (ref 3.7–5.3)
Potassium: 4.7 mEq/L (ref 3.7–5.3)
SODIUM: 167 meq/L — AB (ref 137–147)
Sodium: 171 mEq/L (ref 137–147)

## 2013-03-17 LAB — URINALYSIS, ROUTINE W REFLEX MICROSCOPIC
GLUCOSE, UA: NEGATIVE mg/dL
Ketones, ur: NEGATIVE mg/dL
Leukocytes, UA: NEGATIVE
Nitrite: NEGATIVE
Protein, ur: 30 mg/dL — AB
Specific Gravity, Urine: 1.027 (ref 1.005–1.030)
Urobilinogen, UA: 1 mg/dL (ref 0.0–1.0)
pH: 5 (ref 5.0–8.0)

## 2013-03-17 LAB — CBC WITH DIFFERENTIAL/PLATELET
BASOS ABS: 0.1 10*3/uL (ref 0.0–0.1)
BASOS PCT: 0 % (ref 0–1)
EOS PCT: 0 % (ref 0–5)
Eosinophils Absolute: 0 10*3/uL (ref 0.0–0.7)
HEMATOCRIT: 36.5 % (ref 36.0–46.0)
Hemoglobin: 11.6 g/dL — ABNORMAL LOW (ref 12.0–15.0)
Lymphocytes Relative: 7 % — ABNORMAL LOW (ref 12–46)
Lymphs Abs: 1.6 10*3/uL (ref 0.7–4.0)
MCH: 26.5 pg (ref 26.0–34.0)
MCHC: 31.8 g/dL (ref 30.0–36.0)
MCV: 83.3 fL (ref 78.0–100.0)
MONO ABS: 0.7 10*3/uL (ref 0.1–1.0)
Monocytes Relative: 3 % (ref 3–12)
Neutro Abs: 21.5 10*3/uL — ABNORMAL HIGH (ref 1.7–7.7)
Neutrophils Relative %: 90 % — ABNORMAL HIGH (ref 43–77)
Platelets: 179 10*3/uL (ref 150–400)
RBC: 4.38 MIL/uL (ref 3.87–5.11)
RDW: 16 % — AB (ref 11.5–15.5)
WBC: 23.9 10*3/uL — ABNORMAL HIGH (ref 4.0–10.5)

## 2013-03-17 LAB — URINE MICROSCOPIC-ADD ON

## 2013-03-17 LAB — COMPREHENSIVE METABOLIC PANEL
ALBUMIN: 3.1 g/dL — AB (ref 3.5–5.2)
ALT: 41 U/L — ABNORMAL HIGH (ref 0–35)
AST: 41 U/L — AB (ref 0–37)
Alkaline Phosphatase: 118 U/L — ABNORMAL HIGH (ref 39–117)
BUN: 101 mg/dL — ABNORMAL HIGH (ref 6–23)
CO2: 20 mEq/L (ref 19–32)
CREATININE: 1.68 mg/dL — AB (ref 0.50–1.10)
Calcium: 9.6 mg/dL (ref 8.4–10.5)
Chloride: 130 mEq/L — ABNORMAL HIGH (ref 96–112)
GFR calc Af Amer: 30 mL/min — ABNORMAL LOW (ref >90)
GFR, EST NON AFRICAN AMERICAN: 26 mL/min — AB (ref >90)
Glucose, Bld: 167 mg/dL — ABNORMAL HIGH (ref 70–99)
Potassium: 5.1 mEq/L (ref 3.7–5.3)
Sodium: 172 mEq/L (ref 137–147)
Total Bilirubin: 0.5 mg/dL (ref 0.3–1.2)
Total Protein: 8.4 g/dL — ABNORMAL HIGH (ref 6.0–8.3)

## 2013-03-17 LAB — MRSA PCR SCREENING: MRSA BY PCR: NEGATIVE

## 2013-03-17 LAB — I-STAT CG4 LACTIC ACID, ED: LACTIC ACID, VENOUS: 4.08 mmol/L — AB (ref 0.5–2.2)

## 2013-03-17 LAB — TROPONIN I: Troponin I: <0.3 ng/mL (ref <0.30)

## 2013-03-17 MED ORDER — ONDANSETRON HCL 4 MG/2ML IJ SOLN: 4.0000 mg | Freq: Three times a day (TID) | INTRAMUSCULAR | Status: DC | PRN

## 2013-03-17 MED ORDER — ENOXAPARIN SODIUM 40 MG/0.4ML ~~LOC~~ SOLN: 40.0000 mg | SUBCUTANEOUS | Status: DC

## 2013-03-17 MED ORDER — LATANOPROST 0.005 % OP SOLN
1.0000 [drp] | Freq: Every day | OPHTHALMIC | Status: DC
  Administered 2013-03-17 – 2013-03-24 (×8): 1 [drp] via OPHTHALMIC
  Filled 2013-03-17: qty 2.5

## 2013-03-17 MED ORDER — SODIUM CHLORIDE 0.9 % IV BOLUS (SEPSIS): 500.0000 mL | Freq: Once | INTRAVENOUS | Status: DC

## 2013-03-17 MED ORDER — ALUM & MAG HYDROXIDE-SIMETH 200-200-20 MG/5ML PO SUSP: 30.0000 mL | Freq: Four times a day (QID) | ORAL | Status: DC | PRN

## 2013-03-17 MED ORDER — SODIUM CHLORIDE 0.9 % IV BOLUS (SEPSIS)
500.0000 mL | Freq: Once | INTRAVENOUS | Status: AC
  Administered 2013-03-17: 500 mL via INTRAVENOUS

## 2013-03-17 MED ORDER — MAGNESIUM HYDROXIDE 400 MG/5ML PO SUSP: 30.0000 mL | Freq: Every evening | ORAL | Status: DC | PRN

## 2013-03-17 MED ORDER — DEXTROSE 5 % IV SOLN
INTRAVENOUS | Status: DC
  Administered 2013-03-17: 65 mL via INTRAVENOUS

## 2013-03-17 MED ORDER — ENOXAPARIN SODIUM 30 MG/0.3ML ~~LOC~~ SOLN
20.0000 mg | SUBCUTANEOUS | Status: DC
  Filled 2013-03-17: qty 0.2

## 2013-03-17 MED ORDER — HEPARIN SODIUM (PORCINE) 5000 UNIT/ML IJ SOLN
5000.0000 [IU] | Freq: Three times a day (TID) | INTRAMUSCULAR | Status: DC
  Administered 2013-03-17 – 2013-03-24 (×21): 5000 [IU] via SUBCUTANEOUS
  Filled 2013-03-17 (×26): qty 1

## 2013-03-17 MED ORDER — SODIUM CHLORIDE 0.9 % IJ SOLN
3.0000 mL | Freq: Two times a day (BID) | INTRAMUSCULAR | Status: DC
  Administered 2013-03-18 – 2013-03-24 (×5): 3 mL via INTRAVENOUS

## 2013-03-17 MED ORDER — DEXTROSE-NACL 5-0.45 % IV SOLN
INTRAVENOUS | Status: DC
  Administered 2013-03-17: 16:00:00 via INTRAVENOUS

## 2013-03-17 MED ORDER — MIRTAZAPINE 15 MG PO TABS
15.0000 mg | ORAL_TABLET | Freq: Every day | ORAL | Status: DC
  Filled 2013-03-17 (×2): qty 1

## 2013-03-17 MED ORDER — DEXTROSE-NACL 5-0.45 % IV SOLN: INTRAVENOUS | Status: DC

## 2013-03-17 NOTE — ED Notes (Signed)
Per EMS, pt was found unresponsive by staff, last seen normal at breakfast.  EMS reports applying O2 with pt responding with fidgeting and pulling off facemask.  Per EMS, pt is at baseline mental status.

## 2013-03-17 NOTE — Progress Notes (Signed)
ANTICOAGULATION CONSULT NOTE - Initial Consult  Pharmacy Consult for lovenox Indication: VTE prophylaxis  No Known Allergies  Patient Measurements: Height: 5\' 4"  (162.6 cm) Weight: 98 lb 1.7 oz (44.5 kg) IBW/kg (Calculated) : 54.7   Vital Signs: Temp: 99 F (37.2 C) (02/26 1334) Temp src: Rectal (02/26 1334) BP: 138/75 mmHg (02/26 1649) Pulse Rate: 43 (02/26 1600)  Labs:  Recent Labs  03/17/13 1354  HGB 11.6*  HCT 36.5  PLT 179  CREATININE 1.68*  TROPONINI <0.30    Estimated Creatinine Clearance: 16.3 ml/min (by C-G formula based on Cr of 1.68).   Medical History: Past Medical History  Diagnosis Date  . Dementia   . Dysphasia   . Anemia   . Embolism   . Thrombosis   . Dysphasia     Assessment: Patient is an 80 y.o F presented to the ED with AMS.  To start lovenox for VTE prophylaxis.  Patient has low body weight and renal insufficiency.   Goal of Therapy:  Anti-Xa level 0.3-0.6 Monitor platelets by anticoagulation protocol: Yes   Plan:  1) Will reduce lovenox dose to 20mg  SQ daily  Brant Peets P 03/17/2013,4:54 PM

## 2013-03-17 NOTE — H&P (Signed)
Date: 03/17/2013               Patient Name:  [name] MRN: [id number]  DOB: [birth year] Age / Sex: 80 y.o., female   PCP: No Pcp Per Patient         Medical Service: Internal Medicine Teaching Service         Attending Physician: Dr. Gardiner Barefoot, MD    First Contact: Dr. Mikey Bussing Pager: [phone]  Second Contact: Dr. Zada Girt Pager: [phone]       After Hours (After 5p/  First Contact Pager: [phone]  weekends / holidays): Second Contact Pager: [phone]   Chief Complaint: Altered mental status  History of Present Illness: [name] is a 80 year old female with PMH of Dementia, Dysphasia, wheel chair dependent.  She was sent to the Emergency department today from her SNF due to altered mental status.  Per nursing home staff patient has had a decline in functional status since her Left Tib/fib fracture 12 days ago.  She has had decreased PO intake and become more lethargic (especially after introduction of oxycodone).  Today at lunchtime she became unresponsive, cold and clammy but still with pulse and respirations.  EMS responded and placed her on supplemental O2 and she became slightly more responsive.  In the ED she was found to be hypernatremic, and IMTS was asked to admit. Per SNF nursing: patient has not had any fevers or chills, no diarrhea, mild constipation with introduction of narcotic medications.  No noted cough, dysuria.  Meds: Current Facility-Administered Medications  Medication Dose Route Frequency Provider Last Rate Last Dose  . dextrose 5 %-0.45 % sodium chloride infusion   Intravenous Continuous Tatyana A Kirichenko, PA-C 75 mL/hr at 03/17/13 1530     Current Outpatient Prescriptions  Medication Sig Dispense Refill  . acetaminophen (TYLENOL) 500 MG tablet Take 500 mg by mouth every 6 (six) hours as needed for pain.      Marland Kitchen alum & mag hydroxide-simeth (MAALOX/MYLANTA) 200-200-20 MG/5ML suspension Take 30 mLs by mouth 4 (four) times daily  as needed for indigestion or heartburn.       Marland Kitchen aspirin 325 MG tablet Take 325 mg by mouth daily.      Marland Kitchen guaiFENesin (ROBITUSSIN) 100 MG/5ML SOLN Take 10 mLs by mouth every 4 (four) hours as needed for cough or to loosen phlegm.      . latanoprost (XALATAN) 0.005 % ophthalmic solution Place 1 drop into the left eye at bedtime.      Marland Kitchen loperamide (ANTI-DIARRHEAL) 2 MG capsule Take 2 mg by mouth as needed for diarrhea or loose stools.      Marland Kitchen LORazepam (ATIVAN) 0.5 MG tablet Take 0.5 mg by mouth every 8 (eight) hours as needed for anxiety.      . magnesium hydroxide (MILK OF MAGNESIA) 400 MG/5ML suspension Take 30 mLs by mouth at bedtime as needed for mild constipation.      . mirtazapine (REMERON) 15 MG tablet Take 15 mg by mouth at bedtime.      Marland Kitchen OVER THE COUNTER MEDICATION Take 1 Can by mouth 4 (four) times daily. MIGHTY SHAKES      . oxyCODONE (OXY IR/ROXICODONE) 5 MG immediate release tablet Take 5 mg by mouth 3 (three) times daily.      Marland Kitchen oxyCODONE (OXY IR/ROXICODONE) 5 MG immediate release tablet Take 5 mg by mouth every 4 (four) hours as needed for severe pain.      . potassium chloride 20 MEQ/15ML (  10%) solution Take 15 mLs (20 mEq total) by mouth 2 (two) times daily.  500 mL  0  . sennosides-docusate sodium (SENOKOT-S) 8.6-50 MG tablet Take 2 tablets by mouth 2 (two) times daily.       . Vitamin D, Ergocalciferol, (DRISDOL) 50000 UNITS CAPS capsule Take 50,000 Units by mouth every 30 (thirty) days. on the 17th        Allergies: Allergies as of 03/17/2013  . (No Known Allergies)   Past Medical History  Diagnosis Date  . Dementia   . Dysphasia   . Anemia   . Embolism   . Thrombosis   . Dysphasia    History reviewed. No pertinent past surgical history. History reviewed. No pertinent family history. History   Social History  . Marital Status: Widowed    Spouse Name: N/A    Number of Children: N/A  . Years of Education: N/A   Occupational History  . Not on file.   Social  History Main Topics  . Smoking status: Never Smoker   . Smokeless tobacco: Not on file  . Alcohol Use: No  . Drug Use: No  . Sexual Activity: No   Other Topics Concern  . Not on file   Social History Narrative  . No narrative on file    Review of Systems: Unable to obtain ROS as patient is nonverbal, Level 5 caveat applies  Physical Exam: Blood pressure 134/69, pulse 104, temperature 99 F (37.2 C), temperature source Rectal, resp. rate 27, SpO2 100.00%. Physical Exam  Nursing note and vitals reviewed. Constitutional: No distress.  Cachetic appearing  HENT:  Head: Normocephalic and atraumatic.  Mouth/Throat: Mucous membranes are dry.  Eyes: Pupils are equal, round, and reactive to light.  Enucleation of right eye  Cardiovascular: Normal heart sounds.   No murmur heard. Tachycardic, irregular  Pulmonary/Chest: Effort normal and breath sounds normal. No respiratory distress. She has no wheezes. She has no rales.  Abdominal: Soft. Bowel sounds are normal. She exhibits no distension. There is no tenderness. There is no rebound and no guarding.  Musculoskeletal: She exhibits no edema.  Right AKA. Hard cast to LLE, good capillary refill   Neurological:  Patient can verbalize to painful stimuli, seems to be having visual hallucinations, occasional tracking.  Skin: Skin is warm and dry. She is not diaphoretic.  No sacral pressure ulcer noted.     Lab results: Basic Metabolic Panel:  Recent Labs  16/10/9600/26/15 1354  NA 172*  K 5.1  CL 130*  CO2 20  GLUCOSE 167*  BUN 101*  CREATININE 1.68*  CALCIUM 9.6   Liver Function Tests:  Recent Labs  03/17/13 1354  AST 41*  ALT 41*  ALKPHOS 118*  BILITOT 0.5  PROT 8.4*  ALBUMIN 3.1*   CBC:  Recent Labs  03/17/13 1354  WBC 23.9*  NEUTROABS 21.5*  HGB 11.6*  HCT 36.5  MCV 83.3  PLT 179   Cardiac Enzymes:  Recent Labs  03/17/13 1354  TROPONINI <0.30   Urine Drug Screen: Drugs of Abuse     Component  Value Date/Time   LABOPIA NONE DETECTED 12/11/2006 0008   COCAINSCRNUR NONE DETECTED 12/11/2006 0008   LABBENZ NONE DETECTED 12/11/2006 0008   AMPHETMU NONE DETECTED 12/11/2006 0008   THCU NONE DETECTED 12/11/2006 0008   LABBARB  Value: NONE DETECTED        DRUG SCREEN FOR MEDICAL PURPOSES ONLY.  IF CONFIRMATION IS NEEDED FOR ANY PURPOSE, NOTIFY LAB WITHIN 5 DAYS. 12/11/2006  0008    Alcohol Level: No results found for this basename: ETH,  in the last 72 hours Urinalysis:  Recent Labs  03/17/13 1412  COLORURINE AMBER*  LABSPEC 1.027  PHURINE 5.0  GLUCOSEU NEGATIVE  HGBUR SMALL*  BILIRUBINUR SMALL*  KETONESUR NEGATIVE  PROTEINUR 30*  UROBILINOGEN 1.0  NITRITE NEGATIVE  LEUKOCYTESUR NEGATIVE    Imaging results:  Ct Head Wo Contrast  03/17/2013   CLINICAL DATA:  Altered mental status.  Recent fall.  Dementia.  EXAM: CT HEAD WITHOUT CONTRAST  TECHNIQUE: Contiguous axial images were obtained from the base of the skull through the vertex without contrast.  COMPARISON:  CT HEAD W/O CM dated 02/07/2013; CT C SPINE W/O CM dated 09/08/2012; CT HEAD W/O CM dated 09/08/2012  FINDINGS: Advanced atrophy with chronic microvascular ischemic change. No evidence for acute infarction, hemorrhage, mass lesion, hydrocephalus, or extra-axial fluid. Advanced vascular calcification. No sinus or mastoid fluid. Moderate cerumen fills both ears. There is no skull fracture.  IMPRESSION: Stable exam. Advanced atrophy and chronic microvascular ischemic change.   Electronically Signed   By: Davonna Belling M.D.   On: 03/17/2013 15:09   Dg Chest Portable 1 View  03/17/2013   CLINICAL DATA:  Altered mental status  EXAM: PORTABLE CHEST - 1 VIEW  COMPARISON:  Portable exam 1510 hr compared 09/09/2012  FINDINGS: Normal heart size, mediastinal contours, and pulmonary vascularity.  Atherosclerotic calcification aorta.  Emphysematous changes consistent with COPD.  Skin fold projects over right chest.  No acute infiltrate,  pleural effusion or pneumothorax.  Bones demineralized.  IMPRESSION: COPD changes.  No acute abnormalities ; infiltrate at left base on previous exam resolved.   Electronically Signed   By: Ulyses Southward M.D.   On: 03/17/2013 15:21    Other results: EKG: Sinus tachycardia irregular rate, TWI in V1,V2, aVL these are more pronounced than in previous EKG 02/04/13  Assessment & Plan by Problem:   Acute on chronic Encephalopathy due to Severe Hypernatremia - Patient has history of hypernatremia, this is secondary to her advanced dementia.  Per SNF staff she has been declining functionally since her Tib/fib fracture 2 weeks ago.  At her baseline she can answer questions and verbalize any acute pain or issue, however she has been in increased pain and had decreased PO intake for the last 2 weeks.   A CT of the head in the ED was negative for acute process. - Admit to stepdown  - Start D5W at 65cc/hr. - Frequent monitoring of Na (goal correction <35mEq in first 24 hours) - Sitter at bedside (patient pulling on cardiac monitoring, IV access) - Check Uosmo, Una  Acute renal insufficiency -Creatinine at baseline ~0.6. Today elevated to 1.68. Due to prerenal etiology. - IVF per above.  Leukocytosis -Likely secondary to hemoconcentration, no signs of active infection  Advanced Dementia - Previous SLP eval in 2014 suggested dysphasia 1 diet - Obtain SLP eval   COPD  - No wheezing or hypoxia  Frequent falls, Left Tib/fib fracture - Multiple recent ED admissions for falls, was diagnosed with Tib/fib fracture in 03/05/13.  Prolonged QT -Avoid QT prolonging medications  Diet:NPO Code Status: DNR, HCPOA is Kevia Gant (neice)  DVT PPx: Heparin Knowles Dispo: Disposition is deferred at this time, awaiting improvement of current medical problems. Anticipated discharge in approximately 3 day(s).   The patient does have a current PCP (No Pcp Per Patient) and does not need an [facility] hospital follow-up appointment  after discharge.  The patient does  not have transportation limitations that hinder transportation to clinic appointments.  Signed: Carlynn Purl, DO 03/17/2013, 4:09 PM

## 2013-03-17 NOTE — ED Provider Notes (Signed)
CSN: [id number]     Arrival date & time 03/17/13  1323 History   First MD Initiated Contact with Patient 03/17/13 1330     No chief complaint on file.    (Consider location/radiation/quality/duration/timing/severity/associated sxs/prior Treatment) HPI [name] is a 80 y.o. female who presents to emergency department after an episode of altered mental status while eating lunch. Patient with advanced dementia, she is nonambulatory and nonverbal at baseline. I did call the nursing home facility where patient is coming from, [facility], and spoke to patient's nurse who stated that while eating lunch patient became "limp, she was cold, sweaty, and was not responding to voice stimuli. She was however conscious." EMS was called. An EMS arrived at the scene, they placed her on oxygen and patient began to respond more but was still not at her baseline so she was sent to the emergency department. According to the nursing home there has not been any fevers or chills, she has been at her baseline health up until today.  Past Medical History  Diagnosis Date  . Dementia   . Dysphasia   . Anemia   . Embolism   . Thrombosis   . Dysphasia    History reviewed. No pertinent past surgical history. History reviewed. No pertinent family history. History  Substance Use Topics  . Smoking status: Never Smoker   . Smokeless tobacco: Not on file  . Alcohol Use: No   OB History   Grav Para Term Preterm Abortions TAB SAB Ect Mult Living                 Review of Systems  Unable to perform ROS: Dementia  All other systems reviewed and are negative.      Allergies  Review of patient's allergies indicates no known allergies.  Home Medications   Current Outpatient Rx  Name  Route  Sig  Dispense  Refill  . acetaminophen (TYLENOL) 500 MG tablet   Oral   Take 500 mg by mouth every 6 (six) hours as needed for pain.         Marland Kitchen alum & mag hydroxide-simeth (MAALOX/MYLANTA)  200-200-20 MG/5ML suspension   Oral   Take 30 mLs by mouth 4 (four) times daily as needed for indigestion or heartburn.          Marland Kitchen aspirin 325 MG tablet   Oral   Take 325 mg by mouth daily.         Marland Kitchen guaiFENesin (ROBITUSSIN) 100 MG/5ML SOLN   Oral   Take 10 mLs by mouth every 4 (four) hours as needed for cough or to loosen phlegm.         . latanoprost (XALATAN) 0.005 % ophthalmic solution   Left Eye   Place 1 drop into the left eye at bedtime.         Marland Kitchen loperamide (ANTI-DIARRHEAL) 2 MG capsule   Oral   Take 2 mg by mouth as needed for diarrhea or loose stools.         Marland Kitchen LORazepam (ATIVAN) 0.5 MG tablet   Oral   Take 0.5 mg by mouth every 8 (eight) hours as needed for anxiety.         . magnesium hydroxide (MILK OF MAGNESIA) 400 MG/5ML suspension   Oral   Take 30 mLs by mouth at bedtime as needed for mild constipation.         . mirtazapine (REMERON) 15 MG tablet   Oral   Take 15 mg  by mouth at bedtime.         Marland Kitchen OVER THE COUNTER MEDICATION   Oral   Take 1 Can by mouth 4 (four) times daily. MIGHTY SHAKES         . oxyCODONE (OXY IR/ROXICODONE) 5 MG immediate release tablet   Oral   Take 5 mg by mouth 3 (three) times daily.         Marland Kitchen oxyCODONE (OXY IR/ROXICODONE) 5 MG immediate release tablet   Oral   Take 5 mg by mouth every 4 (four) hours as needed for severe pain.         . potassium chloride 20 MEQ/15ML (10%) solution   Oral   Take 15 mLs (20 mEq total) by mouth 2 (two) times daily.   500 mL   0   . sennosides-docusate sodium (SENOKOT-S) 8.6-50 MG tablet   Oral   Take 2 tablets by mouth 2 (two) times daily.          . Vitamin D, Ergocalciferol, (DRISDOL) 50000 UNITS CAPS capsule   Oral   Take 50,000 Units by mouth every 30 (thirty) days. on the 17th          BP 94/70  Pulse 58  Temp(Src) 99 F (37.2 C) (Rectal)  Resp 26  SpO2 62% Physical Exam  Nursing note and vitals reviewed. Constitutional:  Cachectic, deshelved   HENT:  Head: Normocephalic.  Eyes: EOM are normal.  Right eye close shut. Left pupil reactive, round  Neck: Normal range of motion. Neck supple.  Cardiovascular:  Tachycardic, irregular  Pulmonary/Chest: Effort normal and breath sounds normal. No respiratory distress. She has no wheezes. She has no rales.  Abdominal: Soft. Bowel sounds are normal. She exhibits no distension. There is no tenderness. There is no rebound.  Musculoskeletal:  Right aka, left lower leg in a cast. Good cap refill to the toes  Neurological: She is alert.  Does not follow commands, non verbal. Moving all extremities without difficulties  Skin: Skin is warm and dry.  Psychiatric: She has a normal mood and affect. Her behavior is normal.    ED Course  Procedures (including critical care time) Labs Review Labs Reviewed  CBC WITH DIFFERENTIAL - Abnormal; Notable for the following:    WBC 23.9 (*)    Hemoglobin 11.6 (*)    RDW 16.0 (*)    Neutrophils Relative % 90 (*)    Neutro Abs 21.5 (*)    Lymphocytes Relative 7 (*)    All other components within normal limits  I-STAT CG4 LACTIC ACID, ED - Abnormal; Notable for the following:    Lactic Acid, Venous 4.08 (*)    All other components within normal limits  URINE CULTURE  COMPREHENSIVE METABOLIC PANEL  TROPONIN I  URINALYSIS, ROUTINE W REFLEX MICROSCOPIC   Imaging Review Ct Head Wo Contrast  03/17/2013   CLINICAL DATA:  Altered mental status.  Recent fall.  Dementia.  EXAM: CT HEAD WITHOUT CONTRAST  TECHNIQUE: Contiguous axial images were obtained from the base of the skull through the vertex without contrast.  COMPARISON:  CT HEAD W/O CM dated 02/07/2013; CT C SPINE W/O CM dated 09/08/2012; CT HEAD W/O CM dated 09/08/2012  FINDINGS: Advanced atrophy with chronic microvascular ischemic change. No evidence for acute infarction, hemorrhage, mass lesion, hydrocephalus, or extra-axial fluid. Advanced vascular calcification. No sinus or mastoid fluid. Moderate  cerumen fills both ears. There is no skull fracture.  IMPRESSION: Stable exam. Advanced atrophy and chronic microvascular ischemic change.  Electronically Signed   By: Davonna BellingJohn  Curnes M.D.   On: 03/17/2013 15:09   Dg Chest Portable 1 View  03/17/2013   CLINICAL DATA:  Altered mental status  EXAM: PORTABLE CHEST - 1 VIEW  COMPARISON:  Portable exam 1510 hr compared 09/09/2012  FINDINGS: Normal heart size, mediastinal contours, and pulmonary vascularity.  Atherosclerotic calcification aorta.  Emphysematous changes consistent with COPD.  Skin fold projects over right chest.  No acute infiltrate, pleural effusion or pneumothorax.  Bones demineralized.  IMPRESSION: COPD changes.  No acute abnormalities ; infiltrate at left base on previous exam resolved.   Electronically Signed   By: Ulyses SouthwardMark  Boles M.D.   On: 03/17/2013 15:21    EKG Interpretation   None       MDM   Final diagnoses:  Hypernatremia  Dehydration  Leukocytosis  Altered mental state    Patient with episode of altered mental status apparently not all the back to the baseline. She is DO NOT RESUSCITATE. She is afebrile, however is tachycardiac with increased respiratory rate. She's not hypoxic. We'll get labs, EKG, cxr. Ct.   4:24 PM Patient's CT, chest x-ray negative. EKG shows sinus tachycardia with some ectopic beats. Her blood pressure is elevated, temperature is 90.9 rectally, oxygen saturation is normal. She is alert, but unable to follow any commands and nonverbal. Her electrolytes show significant abnormalities as above. Haven't started her on D5 half-normal saline, patient appears to be very dehydrated. Since she is unassigned, patient admitted to the internal medicine teaching service.  Filed Vitals:   03/17/13 1400 03/17/13 1416 03/17/13 1532 03/17/13 1540  BP: 94/70 133/65  134/69  Pulse: 101 105  104  Temp:      TempSrc:      Resp: 26 29  27   SpO2: 98% 98% 100% 100%       Myriam Jacobsonatyana A Eliah Marquard, PA-C 03/17/13  1625

## 2013-03-17 NOTE — ED Notes (Signed)
Lactic acid results given to Dr. Wofford 

## 2013-03-17 NOTE — Progress Notes (Signed)
Pt arrived from ED, VSS, confused, will continue to monitor.

## 2013-03-18 DIAGNOSIS — E41 Nutritional marasmus: Secondary | ICD-10-CM

## 2013-03-18 DIAGNOSIS — N289 Disorder of kidney and ureter, unspecified: Secondary | ICD-10-CM

## 2013-03-18 DIAGNOSIS — J449 Chronic obstructive pulmonary disease, unspecified: Secondary | ICD-10-CM

## 2013-03-18 DIAGNOSIS — G9349 Other encephalopathy: Secondary | ICD-10-CM

## 2013-03-18 LAB — BASIC METABOLIC PANEL
BUN: 88 mg/dL — ABNORMAL HIGH (ref 6–23)
BUN: 65 mg/dL — AB (ref 6–23)
BUN: 72 mg/dL — AB (ref 6–23)
BUN: 81 mg/dL — ABNORMAL HIGH (ref 6–23)
BUN: 100 mg/dL — AB (ref 6–23)
BUN: 97 mg/dL — AB (ref 6–23)
CALCIUM: 8.8 mg/dL (ref 8.4–10.5)
CALCIUM: 8.7 mg/dL (ref 8.4–10.5)
CALCIUM: 8.5 mg/dL (ref 8.4–10.5)
CO2: 19 mEq/L (ref 19–32)
CO2: 18 meq/L — AB (ref 19–32)
CO2: 18 mEq/L — ABNORMAL LOW (ref 19–32)
CO2: 19 meq/L (ref 19–32)
CO2: 19 meq/L (ref 19–32)
CO2: 19 mEq/L (ref 19–32)
CREATININE: 1.3 mg/dL — AB (ref 0.50–1.10)
CREATININE: 1.16 mg/dL — AB (ref 0.50–1.10)
CREATININE: 1.08 mg/dL (ref 0.50–1.10)
Calcium: 8.9 mg/dL (ref 8.4–10.5)
Calcium: 9 mg/dL (ref 8.4–10.5)
Calcium: 8.9 mg/dL (ref 8.4–10.5)
Chloride: >130 mEq/L (ref 96–112)
Chloride: >130 mEq/L (ref 96–112)
Chloride: >130 mEq/L (ref 96–112)
Chloride: >130 mEq/L (ref 96–112)
Creatinine, Ser: 1.51 mg/dL — ABNORMAL HIGH (ref 0.50–1.10)
Creatinine, Ser: 1.44 mg/dL — ABNORMAL HIGH (ref 0.50–1.10)
Creatinine, Ser: 1.1 mg/dL (ref 0.50–1.10)
GFR calc Af Amer: 34 mL/min — ABNORMAL LOW (ref >90)
GFR calc Af Amer: 41 mL/min — ABNORMAL LOW (ref >90)
GFR calc Af Amer: 47 mL/min — ABNORMAL LOW (ref >90)
GFR calc non Af Amer: 30 mL/min — ABNORMAL LOW (ref >90)
GFR calc non Af Amer: 31 mL/min — ABNORMAL LOW (ref >90)
GFR calc non Af Amer: 36 mL/min — ABNORMAL LOW (ref >90)
GFR calc non Af Amer: 44 mL/min — ABNORMAL LOW (ref >90)
GFR, EST AFRICAN AMERICAN: 36 mL/min — AB (ref >90)
GFR, EST AFRICAN AMERICAN: 50 mL/min — AB (ref >90)
GFR, EST AFRICAN AMERICAN: 52 mL/min — AB (ref >90)
GFR, EST NON AFRICAN AMERICAN: 41 mL/min — AB (ref >90)
GFR, EST NON AFRICAN AMERICAN: 44 mL/min — AB (ref >90)
GLUCOSE: 162 mg/dL — AB (ref 70–99)
GLUCOSE: 203 mg/dL — AB (ref 70–99)
GLUCOSE: 209 mg/dL — AB (ref 70–99)
GLUCOSE: 205 mg/dL — AB (ref 70–99)
Glucose, Bld: 195 mg/dL — ABNORMAL HIGH (ref 70–99)
Glucose, Bld: 143 mg/dL — ABNORMAL HIGH (ref 70–99)
POTASSIUM: 3.9 meq/L (ref 3.7–5.3)
POTASSIUM: 4.1 meq/L (ref 3.7–5.3)
POTASSIUM: 4.1 meq/L (ref 3.7–5.3)
Potassium: 4.2 mEq/L (ref 3.7–5.3)
Potassium: 3.6 mEq/L — ABNORMAL LOW (ref 3.7–5.3)
Potassium: 3.6 mEq/L — ABNORMAL LOW (ref 3.7–5.3)
SODIUM: 165 meq/L — AB (ref 137–147)
SODIUM: 168 meq/L — AB (ref 137–147)
SODIUM: 170 meq/L — AB (ref 137–147)
SODIUM: 169 meq/L — AB (ref 137–147)
Sodium: 172 mEq/L (ref 137–147)
Sodium: 164 mEq/L (ref 137–147)

## 2013-03-18 LAB — GLUCOSE, CAPILLARY
GLUCOSE-CAPILLARY: 159 mg/dL — AB (ref 70–99)
GLUCOSE-CAPILLARY: 173 mg/dL — AB (ref 70–99)
GLUCOSE-CAPILLARY: 179 mg/dL — AB (ref 70–99)
Glucose-Capillary: 167 mg/dL — ABNORMAL HIGH (ref 70–99)
Glucose-Capillary: 84 mg/dL (ref 70–99)

## 2013-03-18 LAB — URINE CULTURE
CULTURE: NO GROWTH
Colony Count: NO GROWTH

## 2013-03-18 LAB — OSMOLALITY, URINE: Osmolality, Ur: 662 mOsm/kg (ref 390–1090)

## 2013-03-18 MED ORDER — DEXTROSE-NACL 5-0.45 % IV SOLN
INTRAVENOUS | Status: DC
  Administered 2013-03-18: 04:00:00 via INTRAVENOUS

## 2013-03-18 MED ORDER — ACETAMINOPHEN 325 MG PO TABS: 650.0000 mg | ORAL_TABLET | Freq: Four times a day (QID) | ORAL | Status: DC | PRN

## 2013-03-18 MED ORDER — CHLORHEXIDINE GLUCONATE 0.12 % MT SOLN
15.0000 mL | Freq: Two times a day (BID) | OROMUCOSAL | Status: DC
  Administered 2013-03-18 – 2013-03-25 (×10): 15 mL via OROMUCOSAL
  Filled 2013-03-18 (×17): qty 15

## 2013-03-18 MED ORDER — ENSURE COMPLETE PO LIQD
237.0000 mL | Freq: Two times a day (BID) | ORAL | Status: DC
  Administered 2013-03-18 – 2013-03-25 (×6): 237 mL via ORAL

## 2013-03-18 MED ORDER — RESOURCE THICKENUP CLEAR PO POWD
ORAL | Status: DC | PRN
  Filled 2013-03-18 (×2): qty 125

## 2013-03-18 MED ORDER — BIOTENE DRY MOUTH MT LIQD
15.0000 mL | Freq: Two times a day (BID) | OROMUCOSAL | Status: DC
  Administered 2013-03-18 – 2013-03-25 (×9): 15 mL via OROMUCOSAL

## 2013-03-18 MED ORDER — DEXTROSE 5 % IV SOLN
INTRAVENOUS | Status: DC
  Administered 2013-03-18: 08:00:00 via INTRAVENOUS
  Administered 2013-03-18 (×2): 125 mL via INTRAVENOUS
  Administered 2013-03-19 – 2013-03-21 (×5): via INTRAVENOUS

## 2013-03-18 NOTE — Progress Notes (Signed)
CRITICAL VALUE ALERT  Critical value received:  Sodium 164  Date of notification:  03/18/13  Time of notification: 2035  Critical value read back: yes  Nurse who received alert:  Roland Earlymeeka Santana Gosdin,MSN,RN  MD notified (1st page):  Md not notified, value decreasing  Time of first page:    MD notified (2nd page):  Time of second page:  Responding MD:   Time MD responded:

## 2013-03-18 NOTE — Evaluation (Signed)
Clinical/Bedside Swallow Evaluation Patient Details  Name: [name] MRN: [id number] Date of Birth: 08-19-1922  Today's Date: 03/18/2013 Time: 4098-1191 SLP Time Calculation (min): 57 min  Past Medical History:  Past Medical History  Diagnosis Date  . Dementia   . Dysphasia   . Anemia   . Embolism   . Thrombosis   . Dysphasia    Past Surgical History: History reviewed. No pertinent past surgical history. HPI:  [name] is a 80 year old female with PMH of Dementia, Dysphasia, wheel chair dependent.  She was sent to the Emergency department today from her SNF due to altered mental status.  Per nursing home staff patient has had a decline in functional status since her Left Tib/fib fracture 12 days ago.  She has had decreased PO intake and become more lethargic (especially after introduction of oxycodone).  Today at lunchtime she became unresponsive, cold and clammy but still with pulse and respirations.  EMS responded and placed her on supplemental O2 and she became slightly more responsive.  In the ED she was found to be hypernatremic, and IMTS was asked to admit.   Assessment / Plan / Recommendation Clinical Impression  Patient with known h/o dysphagia, with dementia pattern of holding each bolus orally.  With each bite/sip administered, pt. stated, "Wait a minute" and required verbal cue to "Swallow."  Positive cough noted after both thin and nectar thick liquids, but no cough with puree or honey thick.  Patient was unable to chew a small piece of soft fruit (SLP removed from mouth).  Oral care and suction equiptment was required prior to this evaluation.  Pt. had thick yellow secretions pooled in oral cavity.    Aspiration Risk  Moderate    Diet Recommendation Dysphagia 1 (Puree);Honey-thick liquid   Liquid Administration via: Cup;No straw Medication Administration: Crushed with puree Supervision: Full supervision/cueing for compensatory  strategies;Staff to assist with self feeding Compensations: Slow rate;Small sips/bites;Check for pocketing Postural Changes and/or Swallow Maneuvers: Seated upright 90 degrees;Upright 30-60 min after meal    Other  Recommendations Oral Care Recommendations: Oral care before and after PO Other Recommendations: Clarify dietary restrictions;Prohibited food (jello, ice cream, thin soups);Order thickener from pharmacy   Follow Up Recommendations  Skilled Nursing facility    Frequency and Duration min 2x/week  2 weeks   Pertinent Vitals/Pain C/O pain in foot (in cast).  RN aware.    SLP Swallow Goals  see care plan   Swallow Study Prior Functional Status       General HPI: [name] is a 80 year old female with PMH of Dementia, Dysphasia, wheel chair dependent.  She was sent to the Emergency department today from her SNF due to altered mental status.  Per nursing home staff patient has had a decline in functional status since her Left Tib/fib fracture 12 days ago.  She has had decreased PO intake and become more lethargic (especially after introduction of oxycodone).  Today at lunchtime she became unresponsive, cold and clammy but still with pulse and respirations.  EMS responded and placed her on supplemental O2 and she became slightly more responsive.  In the ED she was found to be hypernatremic, and IMTS was asked to admit. Type of Study: Bedside swallow evaluation Previous Swallow Assessment: BSE 09/09/12 Dysphagia 1 with honey thick liquid, advanced to nectar.  Diet Prior to this Study: NPO Temperature Spikes Noted: No Respiratory Status: Room air History of Recent Intubation: No Behavior/Cognition: Alert;Confused;Distractible;Requires cueing;Decreased sustained attention Oral Cavity -  Dentition: Edentulous Self-Feeding Abilities: Total assist Patient Positioning: Upright in bed Baseline Vocal Quality: Clear Volitional Cough: Weak Volitional Swallow: Unable to elicit     Oral/Motor/Sensory Function Overall Oral Motor/Sensory Function: Appears within functional limits for tasks assessed   Ice Chips Ice chips: Impaired Presentation: Spoon Oral Phase Impairments: Impaired anterior to posterior transit Oral Phase Functional Implications: Oral holding Pharyngeal Phase Impairments: Throat Clearing - Immediate   Thin Liquid Thin Liquid: Impaired Presentation: Spoon Oral Phase Impairments: Reduced lingual movement/coordination;Impaired anterior to posterior transit Oral Phase Functional Implications: Prolonged oral transit;Oral holding Pharyngeal  Phase Impairments: Suspected delayed Swallow;Decreased hyoid-laryngeal movement;Cough - Immediate    Nectar Thick Nectar Thick Liquid: Impaired Presentation: Cup;Spoon Oral Phase Impairments: Impaired anterior to posterior transit Oral phase functional implications: Prolonged oral transit;Oral holding Pharyngeal Phase Impairments: Suspected delayed Swallow;Decreased hyoid-laryngeal movement;Cough - Immediate   Honey Thick Honey Thick Liquid: Impaired Presentation: Cup Oral Phase Impairments: Impaired anterior to posterior transit Oral Phase Functional Implications: Oral holding   Puree Puree: Impaired Presentation: Spoon Oral Phase Impairments: Reduced lingual movement/coordination;Impaired anterior to posterior transit Oral Phase Functional Implications: Oral holding   Solid   GO    Solid: Impaired Presentation: Spoon Oral Phase Impairments: Reduced lingual movement/coordination;Impaired anterior to posterior transit Oral Phase Functional Implications: Oral holding       [name] T 03/18/2013,11:48 AM

## 2013-03-18 NOTE — Progress Notes (Signed)
CRITICAL VALUE ALERT  Critical value received:  EKG long QTc  Date of notification:  03/18/13  Time of notification:  0700  Critical value read back:yes  Nurse who received alert:  D. Madelin Rearillon, RN  MD notified (1st page):  Burr Medico. Kazibwe  Time of first page:  0705  MD notified (2nd page):  Time of second page:  Responding MD:  Burr Medico. Kazibwe  Time MD responded:  [phone]

## 2013-03-18 NOTE — Progress Notes (Signed)
Utilization review completed.  

## 2013-03-18 NOTE — H&P (Signed)
  Date: 03/18/2013  Patient name: [name]  Medical record number: [id number]  Date of birth: 1922-01-25   I have seen and evaluated [name] and discussed their care with the Residency Team.   Assessment and Plan: I have seen and evaluated the patient as outlined above. I agree with the formulated Assessment and Plan as detailed in the residents' admission note, with the following changes:   1. Will increase IV fluids to account for expected losses, continue to monitor sodium closely and use non narcotic pain control.    Gardiner Barefootobert W Comer, MD 2/27/201510:43 AM

## 2013-03-18 NOTE — Progress Notes (Addendum)
Pt to TX to 2W-17, VSS, called report. Called family to notify of TX.

## 2013-03-18 NOTE — Progress Notes (Signed)
Lab called with result for critical lab values of sodium and chloride.  These are improving from previous results.  Did not call MD as there is not worsening . Pt resting with call bell within reach.  Will continue to monitor. Thomas HoffBurton, Shannan Garfinkel McClintock

## 2013-03-18 NOTE — Progress Notes (Signed)
INITIAL NUTRITION ASSESSMENT  DOCUMENTATION CODES Per approved criteria  -Severe malnutrition in the context of chronic illness -Underweight   INTERVENTION: Diet texture and liquid consistency per SLP discretion. Recommend Magic cup TID with meals, each supplement provides 290 kcal and 9 grams of protein, once diet order permits. Recommend Ensure Complete po BID, each supplement provides 350 kcal and 13 grams of protein, between meals, once diet order permits. Monitor magnesium, potassium, and phosphorus daily for at least 3 days, MD to replete as needed, as pt is at risk for refeeding syndrome given severe malnutrition. RD to continue to follow nutrition care plan.  NUTRITION DIAGNOSIS: Malnutrition related to dementia as evidenced by severe fat and muscle mass loss.   Goal: Diet advancement. Intake to meet >90% of estimated nutrition needs.  Monitor:  weight trends, lab trends, I/O's, diet advancement  Reason for Assessment: Malnutrition Screening Tool  80 y.o. female  Admitting Dx: Hypernatremia  ASSESSMENT: PMHx significant for dementia, dysphagia, and wheel-chair dependency. R AKA. Admitted with AMS. Pt noted by staff to be having an ongoing decline in functional status since her L tib/fib fracture 12 days ago. Work-up reveals severe hypernatremia.  Sodium was 170 on admission. Pt receiving D5W at 65 ml/hr.  Pt with decreased PO intake PTA per H&P. Hx of dysphagia, SLP evaluation pending.  Pt unable to answer questions. Pt is extremely thin with very little muscle and fat mass throughout entire body. Has mittens on with sitter at bedside.  [facility] - pt with orders of remeron and Mighty Shakes QID.  Nutrition Focused Physical Exam:  Subcutaneous Fat:  Orbital Region: severe depletion Upper Arm Region: severe depletion Thoracic and Lumbar Region: severe depletion  Muscle:  Temple Region: severe depletion Clavicle Bone Region:  severe depletion Clavicle and Acromion Bone Region: severe depletion Scapular Bone Region: severe depletion Dorsal Hand: severe depletion Patellar Region: severe depletion Anterior Thigh Region: severe depletion Posterior Calf Region: severe depletion  Edema: n/a  Pt meets criteria for severe MALNUTRITION in the context of chronic illness as evidenced by severe fat and muscle mass loss, likely intake of <75% x at least 1 month.   Height: Ht Readings from Last 1 Encounters:  03/17/13 5\' 4"  (1.626 m)    Weight: Wt Readings from Last 1 Encounters:  03/18/13 91 lb 4.3 oz (41.4 kg)    Ideal Body Weight: 120 lb  % Ideal Body Weight: 76%  Wt Readings from Last 10 Encounters:  03/18/13 91 lb 4.3 oz (41.4 kg)  09/13/12 106 lb 14.8 oz (48.5 kg)    Usual Body Weight: n/a  % Usual Body Weight: n/a  BMI:  Body mass index is 15.66 kg/(m^2). Underweight  Estimated Nutritional Needs: Kcal: 1400 - 1550 Protein: 45 - 55 g Fluid: 1.4 - 1.6 liters daily  Skin: intact  Diet Order: NPO  EDUCATION NEEDS: -No education needs identified at this time   Intake/Output Summary (Last 24 hours) at 03/18/13 0916 Last data filed at 03/18/13 0739  Gross per 24 hour  Intake 1268.41 ml  Output      0 ml  Net 1268.41 ml    Last BM: PTA  Labs:   Recent Labs Lab 03/18/13 0003 03/18/13 0358 03/18/13 0725  NA 170* 172* 169*  K 4.1 4.1 4.2  CL >130* >130* >130*  CO2 19 19 19   BUN 100* 97* 88*  CREATININE 1.51* 1.44* 1.30*  CALCIUM 8.9 9.0 8.9  GLUCOSE 209* 195* 205*  CBG (last 3)   Recent Labs  03/18/13 0359 03/18/13 0616 03/18/13 0745  GLUCAP 167* 84 173*    Scheduled Meds: . antiseptic oral rinse  15 mL Mouth Rinse q12n4p  . chlorhexidine  15 mL Mouth Rinse BID  . heparin subcutaneous  5,000 Units Subcutaneous 3 times per day  . latanoprost  1 drop Left Eye QHS  . sodium chloride  3 mL Intravenous Q12H    Continuous Infusions: . dextrose 125 mL/hr at  03/18/13 1610    Past Medical History  Diagnosis Date  . Dementia   . Dysphasia   . Anemia   . Embolism   . Thrombosis   . Dysphasia     History reviewed. No pertinent past surgical history.  Jarold Motto MS, RD, LDN Inpatient Registered Dietitian Pager: [phone] After-hours pager: [phone]

## 2013-03-18 NOTE — Progress Notes (Deleted)
Pt continues to toggle between SR/afib. While lying flatter for 2D Echo this am, Pt complained of CP. Admin ASA,increased card gtt to 10, suctioned, took BP, paged DR. Will continue to monitor.

## 2013-03-18 NOTE — Progress Notes (Signed)
EKG CRITICAL VALUE     12 lead EKG performed.  Critical value noted.  Kayla Momenteanna Dillon, RN notified.   [name], Larsen Zettel M, CCT 03/18/2013 7:01 AM

## 2013-03-18 NOTE — Progress Notes (Signed)
Subjective: Patient seen at bedside, does not respond to questions, does appear to be in pain from movement of left lower extremity. Objective: Vital signs in last 24 hours: Filed Vitals:   03/18/13 0000 03/18/13 0355 03/18/13 0413 03/18/13 0742  BP: 132/77 124/63  129/59  Pulse: 85 93    Temp: 98.5 F (36.9 C) 98.7 F (37.1 C)  98 F (36.7 C)  TempSrc: Oral Axillary  Oral  Resp: 30 28    Height:      Weight:   91 lb 4.3 oz (41.4 kg)   SpO2: 94% 98%     Weight change:   Intake/Output Summary (Last 24 hours) at 03/18/13 1119 Last data filed at 03/18/13 0739  Gross per 24 hour  Intake 1268.41 ml  Output      0 ml  Net 1268.41 ml   General: resting in bed HEENT: PERRL, EOMI, no scleral icterus Cardiac: RRR, no rubs, murmurs or gallops Pulm: clear to auscultation bilaterally, moving normal volumes of air Abd: soft, nontender, nondistended, BS present Ext: right AKA, left lower extremity in hard cast Neuro: patient tracks with eyes, nonverbal except to pain  Lab Results: Basic Metabolic Panel:  Recent Labs Lab 03/18/13 0358 03/18/13 0725  NA 172* 169*  K 4.1 4.2  CL >130* >130*  CO2 19 19  GLUCOSE 195* 205*  BUN 97* 88*  CREATININE 1.44* 1.30*  CALCIUM 9.0 8.9   Liver Function Tests:  Recent Labs Lab 03/17/13 1354  AST 41*  ALT 41*  ALKPHOS 118*  BILITOT 0.5  PROT 8.4*  ALBUMIN 3.1*   CBC:  Recent Labs Lab 03/17/13 1354  WBC 23.9*  NEUTROABS 21.5*  HGB 11.6*  HCT 36.5  MCV 83.3  PLT 179   Cardiac Enzymes:  Recent Labs Lab 03/17/13 1354  TROPONINI <0.30   CBG:  Recent Labs Lab 03/17/13 2014 03/17/13 2333 03/18/13 0359 03/18/13 0616 03/18/13 0745  GLUCAP 159* 179* 167* 84 173*   Urine Drug Screen: Drugs of Abuse     Component Value Date/Time   LABOPIA NONE DETECTED 12/11/2006 0008   COCAINSCRNUR NONE DETECTED 12/11/2006 0008   LABBENZ NONE DETECTED 12/11/2006 0008   AMPHETMU NONE DETECTED 12/11/2006 0008   THCU NONE  DETECTED 12/11/2006 0008   LABBARB  Value: NONE DETECTED        DRUG SCREEN FOR MEDICAL PURPOSES ONLY.  IF CONFIRMATION IS NEEDED FOR ANY PURPOSE, NOTIFY LAB WITHIN 5 DAYS. 12/11/2006 0008    Alcohol Level: No results found for this basename: ETH,  in the last 168 hours Urinalysis:  Recent Labs Lab 03/17/13 1412  COLORURINE AMBER*  LABSPEC 1.027  PHURINE 5.0  GLUCOSEU NEGATIVE  HGBUR SMALL*  BILIRUBINUR SMALL*  KETONESUR NEGATIVE  PROTEINUR 30*  UROBILINOGEN 1.0  NITRITE NEGATIVE  LEUKOCYTESUR NEGATIVE    Micro Results: Recent Results (from the past 240 hour(s))  MRSA PCR SCREENING     Status: None   Collection Time    03/17/13  4:50 PM      Result Value Ref Range Status   MRSA by PCR NEGATIVE  NEGATIVE Final   Comment:            The GeneXpert MRSA Assay (FDA     approved for NASAL specimens     only), is one component of a     comprehensive MRSA colonization     surveillance program. It is not     intended to diagnose MRSA     infection  nor to guide or     monitor treatment for     MRSA infections.   Studies/Results: Ct Head Wo Contrast  03/17/2013   CLINICAL DATA:  Altered mental status.  Recent fall.  Dementia.  EXAM: CT HEAD WITHOUT CONTRAST  TECHNIQUE: Contiguous axial images were obtained from the base of the skull through the vertex without contrast.  COMPARISON:  CT HEAD W/O CM dated 02/07/2013; CT C SPINE W/O CM dated 09/08/2012; CT HEAD W/O CM dated 09/08/2012  FINDINGS: Advanced atrophy with chronic microvascular ischemic change. No evidence for acute infarction, hemorrhage, mass lesion, hydrocephalus, or extra-axial fluid. Advanced vascular calcification. No sinus or mastoid fluid. Moderate cerumen fills both ears. There is no skull fracture.  IMPRESSION: Stable exam. Advanced atrophy and chronic microvascular ischemic change.   Electronically Signed   By: Davonna Belling M.D.   On: 03/17/2013 15:09   Dg Chest Portable 1 View  03/17/2013   CLINICAL DATA:  Altered  mental status  EXAM: PORTABLE CHEST - 1 VIEW  COMPARISON:  Portable exam 1510 hr compared 09/09/2012  FINDINGS: Normal heart size, mediastinal contours, and pulmonary vascularity.  Atherosclerotic calcification aorta.  Emphysematous changes consistent with COPD.  Skin fold projects over right chest.  No acute infiltrate, pleural effusion or pneumothorax.  Bones demineralized.  IMPRESSION: COPD changes.  No acute abnormalities ; infiltrate at left base on previous exam resolved.   Electronically Signed   By: Ulyses Southward M.D.   On: 03/17/2013 15:21   Medications: I have reviewed the patient's current medications. Scheduled Meds: . antiseptic oral rinse  15 mL Mouth Rinse q12n4p  . chlorhexidine  15 mL Mouth Rinse BID  . heparin subcutaneous  5,000 Units Subcutaneous 3 times per day  . latanoprost  1 drop Left Eye QHS  . sodium chloride  3 mL Intravenous Q12H   Continuous Infusions: . dextrose 125 mL/hr at 03/18/13 0739   PRN Meds:.acetaminophen, alum & mag hydroxide-simeth, magnesium hydroxide Assessment/Plan: Acute on chronic Encephalopathy due to Severe Hypernatremia  - Patient has history of hypernatremia, this is secondary to her advanced dementia. Per SNF staff she has been declining functionally since her Tib/fib fracture 2 weeks ago. Especially with the addition of narcotic pain medication. Free water deficit on admission 4.6L.  Na currently down to 169 from 172 on admission.  - Increase D5W to 125cc/hr.  - Frequent monitoring of Na (goal correction <33mEq in first 24 hours) Q4Hr - Sitter at bedside (patient pulling on cardiac monitoring, IV access)   Acute renal insufficiency  -Creatinine at baseline ~0.6. Due to prerenal etiology.  -Trending down to 1.3 today from 1.68 on admission - IVF per above.    Advanced Dementia  - Previous SLP eval in 2014 suggested dysphasia 1 diet  - Obtain SLP eval   COPD  - No wheezing or hypoxia   Frequent falls, Left Tib/fib fracture  -  Multiple recent ED admissions for falls, was diagnosed with Tib/fib fracture in 03/05/13.  - Tylenol for pain (avoid narcotic medications, NSAIDs currently contraindicated due to AKI)  Prolonged QT  -Avoid QT prolonging medications   Severe Malnutrition in the contest of chronic illness - Magic cup TID with meals, Ensure complete BID between meals once patient given diet. - Monitor Mg, K, Phos daily once resuming feeds to monitor for refeeding.  Diet:NPO pending SLP eval Code Status: DNR,  Dispo: Disposition is deferred at this time, awaiting improvement of current medical problems.  Anticipated discharge in approximately 2  day(s).   The patient does have a current PCP (No Pcp Per Patient) and [facility] after discharge.  The patient does not know have transportation limitations that hinder transportation to clinic appointments.  .Services Needed at time of discharge: Y = Yes, Blank = No PT:   OT:   RN:   Equipment:   Other:     LOS: 1 day   Kayla PurlErik Zed Wanninger, DO 03/18/2013, 11:19 AM

## 2013-03-19 LAB — BASIC METABOLIC PANEL
BUN: 57 mg/dL — ABNORMAL HIGH (ref 6–23)
BUN: 40 mg/dL — ABNORMAL HIGH (ref 6–23)
BUN: 28 mg/dL — ABNORMAL HIGH (ref 6–23)
CALCIUM: 8.5 mg/dL (ref 8.4–10.5)
CALCIUM: 8.4 mg/dL (ref 8.4–10.5)
CALCIUM: 8.2 mg/dL — AB (ref 8.4–10.5)
CO2: 20 mEq/L (ref 19–32)
CO2: 16 mEq/L — ABNORMAL LOW (ref 19–32)
CO2: 18 meq/L — AB (ref 19–32)
CREATININE: 1.06 mg/dL (ref 0.50–1.10)
CREATININE: 0.86 mg/dL (ref 0.50–1.10)
CREATININE: 0.84 mg/dL (ref 0.50–1.10)
Chloride: 128 mEq/L — ABNORMAL HIGH (ref 96–112)
Chloride: 120 mEq/L — ABNORMAL HIGH (ref 96–112)
Chloride: 116 mEq/L — ABNORMAL HIGH (ref 96–112)
GFR calc Af Amer: 70 mL/min — ABNORMAL LOW (ref >90)
GFR calc non Af Amer: 59 mL/min — ABNORMAL LOW (ref >90)
GFR calc non Af Amer: 60 mL/min — ABNORMAL LOW (ref >90)
GFR, EST AFRICAN AMERICAN: 53 mL/min — AB (ref >90)
GFR, EST AFRICAN AMERICAN: 68 mL/min — AB (ref >90)
GFR, EST NON AFRICAN AMERICAN: 45 mL/min — AB (ref >90)
Glucose, Bld: 148 mg/dL — ABNORMAL HIGH (ref 70–99)
Glucose, Bld: 133 mg/dL — ABNORMAL HIGH (ref 70–99)
Glucose, Bld: 128 mg/dL — ABNORMAL HIGH (ref 70–99)
Potassium: 3.4 mEq/L — ABNORMAL LOW (ref 3.7–5.3)
Potassium: 4.1 mEq/L (ref 3.7–5.3)
Potassium: 3.6 mEq/L — ABNORMAL LOW (ref 3.7–5.3)
SODIUM: 162 meq/L — AB (ref 137–147)
Sodium: 150 mEq/L — ABNORMAL HIGH (ref 137–147)
Sodium: 151 mEq/L — ABNORMAL HIGH (ref 137–147)

## 2013-03-19 LAB — RENAL FUNCTION PANEL
Albumin: 2.4 g/dL — ABNORMAL LOW (ref 3.5–5.2)
BUN: 57 mg/dL — ABNORMAL HIGH (ref 6–23)
CALCIUM: 8.6 mg/dL (ref 8.4–10.5)
CO2: 21 mEq/L (ref 19–32)
CREATININE: 1.05 mg/dL (ref 0.50–1.10)
Chloride: 128 mEq/L — ABNORMAL HIGH (ref 96–112)
GFR, EST AFRICAN AMERICAN: 53 mL/min — AB (ref >90)
GFR, EST NON AFRICAN AMERICAN: 46 mL/min — AB (ref >90)
Glucose, Bld: 151 mg/dL — ABNORMAL HIGH (ref 70–99)
PHOSPHORUS: 1.8 mg/dL — AB (ref 2.3–4.6)
Potassium: 3.3 mEq/L — ABNORMAL LOW (ref 3.7–5.3)
SODIUM: 162 meq/L — AB (ref 137–147)

## 2013-03-19 LAB — MAGNESIUM: MAGNESIUM: 2.9 mg/dL — AB (ref 1.5–2.5)

## 2013-03-19 MED ORDER — POTASSIUM CHLORIDE 10 MEQ/100ML IV SOLN
10.0000 meq | INTRAVENOUS | Status: AC
  Administered 2013-03-19 (×3): 10 meq via INTRAVENOUS
  Filled 2013-03-19 (×3): qty 100

## 2013-03-19 NOTE — Progress Notes (Signed)
Subjective: Pt had one episode of tachycardia HR 150s (ST) could be patient was agitated? This AM pt appears comfortable will acknowledge provider but is aphasic (baseline) and appears comfortable. Resting well. VSS this AM.   Objective: Vital signs in last 24 hours: Filed Vitals:   03/18/13 0742 03/18/13 1900 03/18/13 2201 03/19/13 0505  BP: 129/59 119/77 122/77 125/70  Pulse:  85 99 77  Temp: 98 F (36.7 C) 99.5 F (37.5 C) 98.7 F (37.1 C) 97.9 F (36.6 C)  TempSrc: Oral Oral Oral Axillary  Resp:    24  Height:      Weight:    98 lb 12.3 oz (44.8 kg)  SpO2:   100% 94%   Weight change: 10.6 oz (0.3 kg)  Intake/Output Summary (Last 24 hours) at 03/19/13 0741 Last data filed at 03/18/13 2156  Gross per 24 hour  Intake 671.75 ml  Output      0 ml  Net 671.75 ml   General: resting in bed, NAD HEENT: no scleral icterus Cardiac: RRR, no rubs, murmurs or gallops Pulm: clear to auscultation bilaterally, moving normal volumes of air Abd: soft, nontender, nondistended, BS present Ext: right AKA, left lower extremity in hard cast Neuro: patient tracks with eyes, nonverbal   Lab Results: Basic Metabolic Panel:  Recent Labs Lab 03/18/13 1935 03/19/13 0105  NA 164* 162*  162*  K 3.6* 3.4*  3.3*  CL >130* 128*  128*  CO2 19 20  21   GLUCOSE 143* 148*  151*  BUN 65* 57*  57*  CREATININE 1.08 1.06  1.05  CALCIUM 8.5 8.5  8.6  MG  --  2.9*  PHOS  --  1.8*   Liver Function Tests:  Recent Labs Lab 03/17/13 1354 03/19/13 0105  AST 41*  --   ALT 41*  --   ALKPHOS 118*  --   BILITOT 0.5  --   PROT 8.4*  --   ALBUMIN 3.1* 2.4*   CBC:  Recent Labs Lab 03/17/13 1354  WBC 23.9*  NEUTROABS 21.5*  HGB 11.6*  HCT 36.5  MCV 83.3  PLT 179   Cardiac Enzymes:  Recent Labs Lab 03/17/13 1354  TROPONINI <0.30   CBG:  Recent Labs Lab 03/17/13 2014 03/17/13 2333 03/18/13 0359 03/18/13 0616 03/18/13 0745  GLUCAP 159* 179* 167* 84 173*   Medications:  I have reviewed the patient's current medications. Scheduled Meds: . antiseptic oral rinse  15 mL Mouth Rinse q12n4p  . chlorhexidine  15 mL Mouth Rinse BID  . feeding supplement (ENSURE COMPLETE)  237 mL Oral BID BM  . heparin subcutaneous  5,000 Units Subcutaneous 3 times per day  . latanoprost  1 drop Left Eye QHS  . sodium chloride  3 mL Intravenous Q12H   Continuous Infusions: . dextrose 125 mL (03/18/13 2320)   PRN Meds:.acetaminophen, alum & mag hydroxide-simeth, magnesium hydroxide, RESOURCE THICKENUP CLEAR Assessment/Plan: # Severe Hypernatremia: On admission Na 172>>162 this AM. Hard to assess mental status given advanced dementia. Per SNF has had hx of hyperNa that has worsened since tib/fib fracture and narcotic medication.   - cont D5W to 125cc/hr.  - bmet Q4Hr - Sitter at bedside (patient pulling on cardiac monitoring, IV access) and pt with hand mits  # AKI-resolved: most likely pre-renal pt appears dry. Cr 1.4>>1.0 today.   # Advanced Dementia   #COPD: stable pt doesn't appear to be in exacerbation.   # Frequent falls, Left Tib/fib fracture: Multiple recent ED admissions for  falls, was diagnosed with Tib/fib fracture in 03/05/13.  - Tylenol for pain (avoid narcotic medications given ?AMS and hypernatremia)  # Severe Malnutrition: Alb 2.4 and PO4 1.8.  - Magic cup TID with meals, Ensure complete BID between meals once patient given diet. - Monitor Mg, K, Phos daily once resuming feeds to monitor for refeeding syndrome.  Diet: dys III Code Status: DNR,  Dispo: Disposition is deferred at this time, awaiting improvement of current medical problems.  Anticipated discharge in approximately 2 day(s).   The patient does have a current PCP (No Pcp Per Patient) and does not need an [facility] hospital follow-up appointment after discharge.  The patient does not know have transportation limitations that hinder transportation to clinic appointments.  .Services Needed at time of  discharge: Y = Yes, Blank = No PT:   OT:   RN:   Equipment:   Other:     LOS: 2 days   Christen Bame, MD 03/19/2013, 7:41 AM Pgr: [phone]

## 2013-03-19 NOTE — ED Provider Notes (Signed)
Medical screening examination/treatment/procedure(s) were conducted as a shared visit with non-physician practitioner(s) and myself.  I personally evaluated the patient during the encounter.   EKG Interpretation   Date/Time:  Thursday March 17 2013 13:47:53 EST Ventricular Rate:  111 PR Interval:  182 QRS Duration: 64 QT Interval:  381 QTC Calculation: 518 R Axis:   -85 Text Interpretation:  Sinus tachycardia with irregular rate Inferior  infarct, old Consider anterior infarct Prolonged QT interval Confirmed by  Micheline MazeHERTY  MD, MEGAN [phone]) on 03/17/2013 4:27:03 PM        Candyce ChurnJohn David Murat Rideout III, MD 03/19/13 [phone]

## 2013-03-19 NOTE — Progress Notes (Signed)
Clinical Social Work Department BRIEF PSYCHOSOCIAL ASSESSMENT 03/19/2013  Patient:  [name],[name]     Account Number:  [id number]     Admit date:  03/17/2013  Clinical Social Worker:  Hendricks MiloMORGAN,Elhadj Girton, LCSWA  Date/Time:  03/19/2013 06:33 PM  Referred by:  Physician  Date Referred:  03/18/2013 Referred for  SNF Placement   Other Referral:   Interview type:  Family Other interview type:    PSYCHOSOCIAL DATA Living Status:  FAC[facility] Level of care:  Assisted Living Primary support name:  Mammie RussianKevia Grant [phone] Primary support relationship to patient:  FAMILY Degree of support available:   Mammie RussianKevia Grant is patient's niece and HPOA.    CURRENT CONCERNS  Other Concerns:    SOCIAL WORK ASSESSMENT / PLAN Clinical Social Worker (CSW) contacted patient's niece because per chart patient is not alert and oriented. Niece [facility]. [facility] confirmed that patient is a resident there. PT is recommending short term rehab. Niece [place]   Assessment/plan status:  Psychosocial Support/Ongoing Assessment of Needs Other assessment/ plan:   Information/referral to community resources:    PATIENT'S/FAMILY'S RESPONSE TO PLAN OF CARE: Niece thanked CSW for calling and starting placement process.

## 2013-03-19 NOTE — Progress Notes (Signed)
Pt has ordered 3 runs of IV potassium she showed that she was not able to tolerate the rate at 15500mls/hr rate decreased to 50ml she tolerated that rate will continue to monitor. Ilean SkillVeronica Makenly [place] LPN

## 2013-03-19 NOTE — ED Provider Notes (Signed)
Medical screening examination/treatment/procedure(s) were conducted as a shared visit with non-physician practitioner(s) and myself.  I personally evaluated the patient during the encounter.   EKG Interpretation   Date/Time:  Thursday March 17 2013 13:47:53 EST Ventricular Rate:  111 PR Interval:  182 QRS Duration: 64 QT Interval:  381 QTC Calculation: 518 R Axis:   -85 Text Interpretation:  Sinus tachycardia with irregular rate Inferior  infarct, old Consider anterior infarct Prolonged QT interval Confirmed by  DOCHERTY  MD, MEGAN [phone]) on 03/17/2013 4:27:03 PM      80 yo female with hx of dementia presenting with altered mental status.  On exam, elderly, cachectic, responds to verbal stimuli, but does not follow commands, heart sounds normal with regular rhythm with frequent ectopic beats, lungs CTAB, abd soft and nontender.  Labs show severe hypernatremia.  Admitted to internal medicine.  Clinical Impression: 1. Hypernatremia   2. Dehydration   3. Leukocytosis   4. Altered mental state       Merrie RoofJohn David Amaranta Mehl III, MD 03/19/13 [id number]

## 2013-03-19 NOTE — Evaluation (Signed)
Physical Therapy Evaluation Patient Details Name: [name] MRN: [id number] DOB: Feb 09, 1922 Today's Date: 03/19/2013 Time: 8295-6213 PT Time Calculation (min): 15 min  PT Assessment / Plan / Recommendation History of Present Illness  [name] is a 80 year old female with PMH of Dementia, Dysphasia, wheel chair dependent.  She was sent to the Emergency department today from her SNF due to altered mental status.   Patient with Harmon/o Rt AKA, and had fall with Lt tib/fib fractures 12 days pta - in short leg cast.  Clinical Impression  Patient presents with problems listed below.  Will benefit from acute PT to maximize functional mobility prior to discharge.  Recommend SNF at discharge.    PT Assessment  Patient needs continued PT services    Follow Up Recommendations  SNF    Does the patient have the potential to tolerate intense rehabilitation      Barriers to Discharge        Equipment Recommendations  None recommended by PT    Recommendations for Other Services     Frequency Min 2X/week    Precautions / Restrictions Precautions Precautions: Fall Precaution Comments: Harmon/o falls Required Braces or Orthoses: Other Brace/Splint Other Brace/Splint: Lt short leg cast   Pertinent Vitals/Pain       Mobility  Bed Mobility Overal bed mobility: Needs Assistance;+2 for physical assistance Bed Mobility: Rolling;Sidelying to Sit Rolling: Total assist;+2 for physical assistance Sidelying to sit: Total assist;+2 for physical assistance General bed mobility comments: Patient requires +2 total assist for all bed mobility.  On attempts to sit on EOB, patient pushes into extension with trunk and UE's, resisting mobility.  Moans with attempts to move LLE.    Exercises     PT Diagnosis: Generalized weakness;Acute pain;Altered mental status  PT Problem List: Decreased strength;Decreased range of motion;Decreased activity tolerance;Decreased balance;Decreased  mobility;Decreased cognition;Pain PT Treatment Interventions: Functional mobility training;Balance training;Patient/family education     PT Goals(Current goals can be found in the care plan section) Acute Rehab PT Goals Patient Stated Goal: Unable to state PT Goal Formulation: Patient unable to participate in goal setting Time For Goal Achievement: 04/02/13 Potential to Achieve Goals: Fair  Visit Information  Last PT Received On: 03/19/13 Assistance Needed: +2 History of Present Illness: [name] is a 80 year old female with PMH of Dementia, Dysphasia, wheel chair dependent.  She was sent to the Emergency department today from her SNF due to altered mental status.   Patient with Harmon/o Rt AKA, and had fall with Lt tib/fib fractures 12 days pta - in short leg cast.       Prior Functioning  Home Living Family/patient expects to be discharged to:: Skilled nursing facility Prior Function Level of Independence: Needs assistance Gait / Transfers Assistance Needed: Assist with transfers to wheelchair.  Non-ambulatory ADL's / Homemaking Assistance Needed: Assist with bathing, dressing, meal prep, etc. Communication Communication: No difficulties    Cognition  Cognition Arousal/Alertness: Lethargic Behavior During Therapy: Restless;Agitated Overall Cognitive Status: No family/caregiver present to determine baseline cognitive functioning Area of Impairment: Orientation;Attention;Following commands;Awareness Orientation Level: Disoriented to;Place;Time;Situation Current Attention Level: Focused Memory: Decreased short-term memory Following Commands:  (Not following commands) General Comments: Patient not following commands with increased time given.      Extremity/Trunk Assessment Upper Extremity Assessment Upper Extremity Assessment: Difficult to assess due to impaired cognition (Moving voluntarily) Lower Extremity Assessment Lower Extremity Assessment: RLE  deficits/detail;LLE deficits/detail RLE Deficits / Details: AKA - able to lift limb against gravity.  Holds  limb in hip flexion position LLE Deficits / Details: Holding LLE in flexed position.  Moans with passive movement of LLE.  Short leg cast in place. LLE: Unable to fully assess due to pain;Unable to fully assess due to immobilization   Balance    End of Session PT - End of Session Activity Tolerance: Patient limited by fatigue;Patient limited by pain Patient left: in bed;with call bell/phone within reach;with nursing/sitter in room;with restraints reapplied (mittens on hands) Nurse Communication: Need for lift equipment  GP     [name], [name] 03/19/2013, 2:49 PM [name], PT, [facility] Acute Rehab Services Pager [phone]

## 2013-03-19 NOTE — Progress Notes (Signed)
MD on call notified that pt hr was 153. Page was received from central monitoring, pt was asymptomatic receiving 2nd run of potassium infusing pt has order for 3 run for a K level of 3.4. Vital signs are WNL and charted in Epic. MD requested that notification of when runs of K are done to order labs at completion. Will continue to monitor Kayla SkillVeronica Erline Siddoway LPN

## 2013-03-20 LAB — RENAL FUNCTION PANEL
Albumin: 2.1 g/dL — ABNORMAL LOW (ref 3.5–5.2)
BUN: 23 mg/dL (ref 6–23)
CALCIUM: 8.3 mg/dL — AB (ref 8.4–10.5)
CO2: 19 mEq/L (ref 19–32)
Chloride: 113 mEq/L — ABNORMAL HIGH (ref 96–112)
Creatinine, Ser: 0.85 mg/dL (ref 0.50–1.10)
GFR calc non Af Amer: 59 mL/min — ABNORMAL LOW (ref >90)
GFR, EST AFRICAN AMERICAN: 69 mL/min — AB (ref >90)
Glucose, Bld: 122 mg/dL — ABNORMAL HIGH (ref 70–99)
Phosphorus: 2.4 mg/dL (ref 2.3–4.6)
Potassium: 4.1 mEq/L (ref 3.7–5.3)
SODIUM: 147 meq/L (ref 137–147)

## 2013-03-20 LAB — MAGNESIUM: Magnesium: 2.5 mg/dL (ref 1.5–2.5)

## 2013-03-20 NOTE — Progress Notes (Signed)
Clinical Social Work Department CLINICAL SOCIAL WORK PLACEMENT NOTE 03/20/2013  Patient:  [name],[name]  Account Number:  [id number] Admit date:  03/17/2013  Clinical Social Worker:  Jetta LoutBAILEY MORGAN, Theresia MajorsLCSWA  Date/time:  03/20/2013 08:34 AM  Clinical Social Work is seeking post-discharge placement for this patient at the following level of care:   SKILLED NURSING   (*CSW will update this form in Epic as items are completed)   03/20/2013  [facility]family provided with [facility] of Clinical Social Work's list of facilities offering this level of care within the geographic area requested by the patient (or if unable, by the patient's family).  03/20/2013  Patient/family informed of their freedom to choose among providers that offer the needed level of care, that participate in Medicare, Medicaid or managed care program needed by the patient, have an available bed and are willing to accept the patient.  03/20/2013  Patient/family informed of [facility]' ownership interest in [facility], as well as of the fact that they are under no obligation to receive care at this facility.  PASARR submitted to EDS on  PASARR number received from EDS on   FL2 transmitted to all facilities in geographic area requested by pt/family on  03/20/2013 FL2 transmitted to all facilities within larger geographic area on   Patient informed that his/her managed care company has contracts with or will negotiate with  certain facilities, including the following:     Patient/family informed of bed offers received:   Patient chooses bed at  Physician recommends and patient chooses bed at    Patient to be transferred to  on   Patient to be transferred to facility by   The following physician request were entered in Epic:   Additional Comments: [place] [facility] would not let me submit PASARR.

## 2013-03-20 NOTE — Progress Notes (Signed)
Subjective: No acute events overnight. VSS with no repeat ST. Pt did eat some of dinner.   Objective: Vital signs in last 24 hours: Filed Vitals:   03/19/13 0505 03/19/13 1350 03/19/13 2039 03/20/13 0437  BP: 125/70 114/73 111/69 123/57  Pulse: 77 99 89 86  Temp: 97.9 F (36.6 C) 98 F (36.7 C) 98.1 F (36.7 C) 97.6 F (36.4 C)  TempSrc: Axillary Axillary Oral Oral  Resp: 24 20 18 18   Height:      Weight: 98 lb 12.3 oz (44.8 kg)   98 lb 12.3 oz (44.8 kg)  SpO2: 94% 100% 100% 99%   Weight change: 0 lb (0 kg)  Intake/Output Summary (Last 24 hours) at 03/20/13 0920 Last data filed at 03/19/13 2154  Gross per 24 hour  Intake 1982.5 ml  Output      0 ml  Net 1982.5 ml   General: resting in bed, NAD HEENT: no scleral icterus Cardiac: RRR, no rubs, murmurs or gallops Pulm: clear to auscultation bilaterally, moving normal volumes of air Abd: soft, nontender, nondistended, BS present Ext: right AKA, left lower extremity in hard cast Neuro: patient tracks with eyes, nonverbal   Lab Results: Basic Metabolic Panel:  Recent Labs Lab 03/19/13 0105  03/19/13 2237 03/20/13 0400  NA 162*  162*  < > 151* 147  K 3.4*  3.3*  < > 3.6* 4.1  CL 128*  128*  < > 116* 113*  CO2 20  21  < > 18* 19  GLUCOSE 148*  151*  < > 128* 122*  BUN 57*  57*  < > 28* 23  CREATININE 1.06  1.05  < > 0.84 0.85  CALCIUM 8.5  8.6  < > 8.2* 8.3*  MG 2.9*  --   --  2.5  PHOS 1.8*  --   --  2.4  < > = values in this interval not displayed. Liver Function Tests:  Recent Labs Lab 03/17/13 1354 03/19/13 0105 03/20/13 0400  AST 41*  --   --   ALT 41*  --   --   ALKPHOS 118*  --   --   BILITOT 0.5  --   --   PROT 8.4*  --   --   ALBUMIN 3.1* 2.4* 2.1*   CBC:  Recent Labs Lab 03/17/13 1354  WBC 23.9*  NEUTROABS 21.5*  HGB 11.6*  HCT 36.5  MCV 83.3  PLT 179   Cardiac Enzymes:  Recent Labs Lab 03/17/13 1354  TROPONINI <0.30   CBG:  Recent Labs Lab 03/17/13 2014  03/17/13 2333 03/18/13 0359 03/18/13 0616 03/18/13 0745  GLUCAP 159* 179* 167* 84 173*   Medications: I have reviewed the patient's current medications. Scheduled Meds: . antiseptic oral rinse  15 mL Mouth Rinse q12n4p  . chlorhexidine  15 mL Mouth Rinse BID  . feeding supplement (ENSURE COMPLETE)  237 mL Oral BID BM  . heparin subcutaneous  5,000 Units Subcutaneous 3 times per day  . latanoprost  1 drop Left Eye QHS  . sodium chloride  3 mL Intravenous Q12H   Continuous Infusions: . dextrose 75 mL/hr at 03/20/13 0917   PRN Meds:.acetaminophen, alum & mag hydroxide-simeth, magnesium hydroxide, RESOURCE THICKENUP CLEAR Assessment/Plan: # Severe Hypernatremia: On admission Na 172>>162>>147 this AM. Hard to assess mental status given advanced dementia. Per SNF has had hx of hyperNa that has worsened since tib/fib fracture and narcotic medication.   - cont D5W at decreased rate 75cc/hr until Na 140 -  bmet Q12Hr - Sitter at bedside (patient pulling on cardiac monitoring, IV access) and pt with hand mits  # AKI-resolved: most likely pre-renal pt appears dry. Cr 1.4>>1.0 today.   # Advanced Dementia   #COPD: stable pt doesn't appear to be in exacerbation.   # Frequent falls, Left Tib/fib fracture: Multiple recent ED admissions for falls, was diagnosed with Tib/fib fracture in 03/05/13.  - Tylenol for pain (avoid narcotic medications given ?AMS and hypernatremia)  # Severe Malnutrition: Alb 2.4 and PO4 1.8.  - Magic cup TID with meals, Ensure complete BID between meals once patient given diet. - Monitor Mg, K, Phos daily once resuming feeds to monitor for refeeding syndrome.  Diet: dys I Code Status: DNR,  Dispo: Disposition is deferred at this time, awaiting improvement of current medical problems.  Anticipated discharge in approximately 2 day(s).   The patient does have a current PCP (No Pcp Per Patient) and [facility] after  [facility]charge.  The patient does not know have transportation limitations that hinder transportation to clinic appointments.  .Services Needed at time of discharge: Y = Yes, Blank = No PT:   OT:   RN:   Equipment:   Other:     LOS: 3 days   Christen BameNora Thessaly Mccullers, MD 03/20/2013, 9:20 AM Pgr: [phone]

## 2013-03-21 DIAGNOSIS — N390 Urinary tract infection, site not specified: Secondary | ICD-10-CM

## 2013-03-21 DIAGNOSIS — F0390 Unspecified dementia without behavioral disturbance: Secondary | ICD-10-CM

## 2013-03-21 DIAGNOSIS — S82209A Unspecified fracture of shaft of unspecified tibia, initial encounter for closed fracture: Secondary | ICD-10-CM

## 2013-03-21 DIAGNOSIS — E870 Hyperosmolality and hypernatremia: Principal | ICD-10-CM

## 2013-03-21 DIAGNOSIS — W19XXXA Unspecified fall, initial encounter: Secondary | ICD-10-CM

## 2013-03-21 DIAGNOSIS — S82409A Unspecified fracture of shaft of unspecified fibula, initial encounter for closed fracture: Secondary | ICD-10-CM

## 2013-03-21 LAB — URINALYSIS, ROUTINE W REFLEX MICROSCOPIC
BILIRUBIN URINE: NEGATIVE
GLUCOSE, UA: NEGATIVE mg/dL
KETONES UR: NEGATIVE mg/dL
Nitrite: POSITIVE — AB
PH: 5.5 (ref 5.0–8.0)
Protein, ur: 30 mg/dL — AB
Specific Gravity, Urine: 1.014 (ref 1.005–1.030)
Urobilinogen, UA: 1 mg/dL (ref 0.0–1.0)

## 2013-03-21 LAB — BASIC METABOLIC PANEL
BUN: 15 mg/dL (ref 6–23)
CHLORIDE: 103 meq/L (ref 96–112)
CO2: 18 meq/L — AB (ref 19–32)
CREATININE: 0.83 mg/dL (ref 0.50–1.10)
Calcium: 8.3 mg/dL — ABNORMAL LOW (ref 8.4–10.5)
GFR calc Af Amer: 71 mL/min — ABNORMAL LOW (ref >90)
GFR calc non Af Amer: 61 mL/min — ABNORMAL LOW (ref >90)
Glucose, Bld: 126 mg/dL — ABNORMAL HIGH (ref 70–99)
Potassium: 3.3 mEq/L — ABNORMAL LOW (ref 3.7–5.3)
Sodium: 136 mEq/L — ABNORMAL LOW (ref 137–147)

## 2013-03-21 LAB — URINE MICROSCOPIC-ADD ON

## 2013-03-21 MED ORDER — CEFTRIAXONE SODIUM 1 G IJ SOLR
1.0000 g | INTRAMUSCULAR | Status: DC
  Administered 2013-03-21 – 2013-03-24 (×4): 1 g via INTRAVENOUS
  Filled 2013-03-21 (×5): qty 10

## 2013-03-21 MED ORDER — SODIUM CHLORIDE 0.9 % IV SOLN
INTRAVENOUS | Status: DC
  Administered 2013-03-21: 11:00:00 via INTRAVENOUS

## 2013-03-21 MED ORDER — POTASSIUM CHLORIDE 10 MEQ/100ML IV SOLN
10.0000 meq | INTRAVENOUS | Status: AC
  Administered 2013-03-21 (×4): 10 meq via INTRAVENOUS
  Filled 2013-03-21 (×4): qty 100

## 2013-03-21 NOTE — Progress Notes (Signed)
CSW spoke to patient's family member, Tamela OddiKevia and provided bed offers. Tamela OddiKevia explained to Child psychotherapistsocial worker she was not too sure which one to choose, but then states [facility]. CSW will follow up with facility to confirm bed.  Maree KrabbeLindsay Bryson Palen, MSW, Theresia MajorsLCSWA [phone]

## 2013-03-21 NOTE — Progress Notes (Signed)
Pt is not eating or drinking. Her urine has a strong odor and leaves an orange ring on the sheets.

## 2013-03-21 NOTE — Progress Notes (Signed)
Subjective:  Nurse reports discolored foul smelling urine. Patient continues to not eat or drink. Patient is not oriented and responds verbally occasionally and without meaning.  Objective: Vital signs in last 24 hours: Filed Vitals:   03/20/13 1417 03/20/13 2216 03/21/13 0651 03/21/13 0700  BP: 104/86 130/86 111/58   Pulse:  93 91   Temp: 98.7 F (37.1 C) 97.7 F (36.5 C) 98.2 F (36.8 C)   TempSrc: Oral Oral Oral   Resp: 19 18 18    Height:      Weight:    98 lb 15.8 oz (44.9 kg)  SpO2: 98% 99% 96%    Weight change: 3.5 oz (0.1 kg)  Intake/Output Summary (Last 24 hours) at 03/21/13 0854 Last data filed at 03/20/13 2306  Gross per 24 hour  Intake   1000 ml  Output      0 ml  Net   1000 ml   Physical Exam  Constitutional: She appears well-developed and well-nourished. No distress.  HENT:  Mouth/Throat: No oropharyngeal exudate.  Cardiovascular: Normal rate, regular rhythm, normal heart sounds and intact distal pulses.  Exam reveals no gallop and no friction rub.   No murmur heard. Pulmonary/Chest: Effort normal and breath sounds normal. No respiratory distress. She has no wheezes. She has no rales.  Abdominal:  Suprapubic tenderness.  Musculoskeletal: She exhibits no tenderness.  Right BKA  Neurological:  Patient is not orientated. She responds minimally to verbal stimulus. She responds to painful stimulus with grunt. She will regard team with transient eye sight.  Skin: She is not diaphoretic.    Lab Results: Basic Metabolic Panel:  Recent Labs Lab 03/19/13 0105  03/20/13 0400 03/21/13 0337  NA 162*  162*  < > 147 136*  K 3.4*  3.3*  < > 4.1 3.3*  CL 128*  128*  < > 113* 103  CO2 20  21  < > 19 18*  GLUCOSE 148*  151*  < > 122* 126*  BUN 57*  57*  < > 23 15  CREATININE 1.06  1.05  < > 0.85 0.83  CALCIUM 8.5  8.6  < > 8.3* 8.3*  MG 2.9*  --  2.5  --   PHOS 1.8*  --  2.4  --   < > = values in this interval not displayed. Liver Function  Tests:  Recent Labs Lab 03/17/13 1354 03/19/13 0105 03/20/13 0400  AST 41*  --   --   ALT 41*  --   --   ALKPHOS 118*  --   --   BILITOT 0.5  --   --   PROT 8.4*  --   --   ALBUMIN 3.1* 2.4* 2.1*   CBC:  Recent Labs Lab 03/17/13 1354  WBC 23.9*  NEUTROABS 21.5*  HGB 11.6*  HCT 36.5  MCV 83.3  PLT 179   Cardiac Enzymes:  Recent Labs Lab 03/17/13 1354  TROPONINI <0.30   CBG:  Recent Labs Lab 03/17/13 2014 03/17/13 2333 03/18/13 0359 03/18/13 0616 03/18/13 0745  GLUCAP 159* 179* 167* 84 173*   Urine Drug Screen: Drugs of Abuse     Component Value Date/Time   LABOPIA NONE DETECTED 12/11/2006 0008   COCAINSCRNUR NONE DETECTED 12/11/2006 0008   LABBENZ NONE DETECTED 12/11/2006 0008   AMPHETMU NONE DETECTED 12/11/2006 0008   THCU NONE DETECTED 12/11/2006 0008   LABBARB  Value: NONE DETECTED        DRUG SCREEN FOR MEDICAL PURPOSES ONLY.  IF  CONFIRMATION IS NEEDED FOR ANY PURPOSE, NOTIFY LAB WITHIN 5 DAYS. 12/11/2006 0008    Urinalysis:  Recent Labs Lab 03/17/13 1412  COLORURINE AMBER*  LABSPEC 1.027  PHURINE 5.0  GLUCOSEU NEGATIVE  HGBUR SMALL*  BILIRUBINUR SMALL*  KETONESUR NEGATIVE  PROTEINUR 30*  UROBILINOGEN 1.0  NITRITE NEGATIVE  LEUKOCYTESUR NEGATIVE   Micro Results: Recent Results (from the past 240 hour(s))  URINE CULTURE     Status: None   Collection Time    03/17/13  2:12 PM      Result Value Ref Range Status   Specimen Description URINE, CATHETERIZED   Final   Special Requests NONE   Final   Culture  Setup Time     Final   Value: 03/17/2013 14:37     Performed at [facility]   Colony Count     Final   Value: NO GROWTH     Performed at [facility]   Culture     Final   Value: NO GROWTH     Performed at [facility]   Report Status 03/18/2013 FINAL   Final  MRSA PCR SCREENING     Status: None   Collection Time    03/17/13  4:50 PM      Result Value Ref Range Status   MRSA by PCR NEGATIVE  NEGATIVE  Final   Comment:            The GeneXpert MRSA Assay (FDA     approved for NASAL specimens     only), is one component of a     comprehensive MRSA colonization     surveillance program. It is not     intended to diagnose MRSA     infection nor to guide or     monitor treatment for     MRSA infections.   Studies/Results: No results found. Medications: I have reviewed the patient's current medications. Scheduled Meds: . antiseptic oral rinse  15 mL Mouth Rinse q12n4p  . cefTRIAXone (ROCEPHIN)  IV  1 g Intravenous Q24H  . chlorhexidine  15 mL Mouth Rinse BID  . feeding supplement (ENSURE COMPLETE)  237 mL Oral BID BM  . heparin subcutaneous  5,000 Units Subcutaneous 3 times per day  . latanoprost  1 drop Left Eye QHS  . potassium chloride  10 mEq Intravenous Q2H  . sodium chloride  3 mL Intravenous Q12H   Continuous Infusions:  PRN Meds:.acetaminophen, alum & mag hydroxide-simeth, magnesium hydroxide, RESOURCE THICKENUP CLEAR Assessment/Plan: Principal Problem:   Hypernatremia Active Problems:   Dementia   Acute renal injury   Encephalopathy  Hypernatremia: Likely due to decrease PO intake of food and water. Corrected with D5W. Patient continues to not eat or drink.  - D/C D5W - Start NS at 75 cc/hr  Dementia I spoke with patients HCPOA Mammie Russian(Kevia Grant: [phone]). She had not spoken to patient in at least 3 months. She states that she use to be able to talk and eat w/o problems. However, med records indicate that she has been poorly responsive to verbal communication since at least August 2014. The patients POA will attempt to talk to patient over the phone. If unsuccessful, [place]. The POA is limited because she states that she cannot walk. - Patient appears to be at or approaching baseline mental status.  Frequent falls, Left Tib/fib fracture:  Diagnosed with Tib/fib fracture in 03/05/13. Cast in place.  - Tylenol for pain (avoid narcotic  medications given dementia) - Patient has f/u with ortho according to nurse at ALF  UTI Patient has likely UTI given UA positive for leuks, nitrite, and hg. As patient is not tolerating PO, will give ceftriaxone IV.  AKI Etiology was likely prerenal given profound dehydration. Resolved.  Dispo: Disposition is deferred at this time, awaiting improvement of current medical problems.  Anticipated discharge in approximately 1 day(s).   PCP Deanne Coffer PA, MD Ron Parker, MD The patient does have a current PCP (No Pcp Per Patient) and does not need an [facility] hospital follow-up appointment after discharge.  The patient does have transportation limitations that hinder transportation to clinic appointments.  .Services Needed at time of discharge: Y = Yes, Blank = No PT:   OT:   RN:   Equipment:   Other:     LOS: 4 days   Pleas Koch, MD 03/21/2013, 8:54 AM

## 2013-03-21 NOTE — Progress Notes (Signed)
OT Cancellation Note  Patient Details Name: Kayla RossettiMildred Harmon MRN: [id number] DOB: 1922-11-06   Cancelled Treatment:      Pt is Medicare and current D/C plan is SNF. No apparent immediate acute care OT needs, therefore will defer OT to SNF. If OT eval is needed please call Acute Rehab Dept. at [phone] or text page OT at [phone].    Evette GeorgesLeonard, [name] [name] [phone] 03/21/2013, 7:22 AM

## 2013-03-21 NOTE — Progress Notes (Signed)
Internal Medicine Attending  Date: 03/21/2013  Patient name: Kayla RossettiMildred Harmon Medical record number: [id number] Date of birth: Feb 10, 1922 Age: 80 y.o. Gender: female  I saw and evaluated the patient, and discussed her care on A.M rounds with housestaff.  I reviewed the resident's note by Dr. Glendell DockerKomanski and I agree with the resident's findings and plans as documented in his note.

## 2013-03-22 DIAGNOSIS — N390 Urinary tract infection, site not specified: Secondary | ICD-10-CM

## 2013-03-22 LAB — BASIC METABOLIC PANEL
BUN: 16 mg/dL (ref 6–23)
CO2: 18 mEq/L — ABNORMAL LOW (ref 19–32)
CREATININE: 0.71 mg/dL (ref 0.50–1.10)
Calcium: 8.2 mg/dL — ABNORMAL LOW (ref 8.4–10.5)
Chloride: 113 mEq/L — ABNORMAL HIGH (ref 96–112)
GFR, EST AFRICAN AMERICAN: 87 mL/min — AB (ref >90)
GFR, EST NON AFRICAN AMERICAN: 75 mL/min — AB (ref >90)
Glucose, Bld: 107 mg/dL — ABNORMAL HIGH (ref 70–99)
Potassium: 3.2 mEq/L — ABNORMAL LOW (ref 3.7–5.3)
Sodium: 147 mEq/L (ref 137–147)

## 2013-03-22 MED ORDER — POTASSIUM CHLORIDE 20 MEQ/15ML (10%) PO LIQD
40.0000 meq | Freq: Once | ORAL | Status: DC
  Filled 2013-03-22: qty 30

## 2013-03-22 NOTE — Progress Notes (Signed)
Internal Medicine Attending  Date: 03/22/2013  Patient name: Kayla RossettiMildred Harmon Medical record number: [id number] Date of birth: 04-01-1922 Age: 80 y.o. Gender: female  I saw and evaluated the patient. I reviewed the resident's note by Dr. Glendell DockerKomanski and I agree with the resident's findings and plans as documented in his note, with the following additional comments.  Would repeat CBC to document resolution of marked leukocytosis noted earlier in the hospitalization.

## 2013-03-22 NOTE — Progress Notes (Signed)
Speech Language Pathology Treatment: Dysphagia  Patient Details Name: [name] MRN: [id number] DOB: 1922/06/18 Today's Date: 03/22/2013 Time: 1000-1015 SLP Time Calculation (min): 15 min  Assessment / Plan / Recommendation Clinical Impression  RN reports pt taking little to no PO, orally holds boluses, then spits out. SLP positioned pt fully upright, provided cues for pt to feed herself via straw and cup. Pt accepted and transited liquid boluses with only brief holding, responsive to verbal cues to swallow. She did have delayed cough following sips of thin liquids, but tolerated nectar via straw well. Pt may upgrade to nectar thick and puree. Discussed strategies to promote swallow with RN.    HPI HPI: [name] is a 80 year old female with PMH of Dementia, Dysphasia, wheel chair dependent.  She was sent to the Emergency department today from her SNF due to altered mental status.  Per nursing home staff patient has had a decline in functional status since her Left Tib/fib fracture 12 days ago.  She has had decreased PO intake and become more lethargic (especially after introduction of oxycodone).  Today at lunchtime she became unresponsive, cold and clammy but still with pulse and respirations.  EMS responded and placed her on supplemental O2 and she became slightly more responsive.  In the ED she was found to be hypernatremic, and IMTS was asked to admit.   Pertinent Vitals NA  SLP Plan  Continue with current plan of care    Recommendations Diet recommendations: Dysphagia 1 (puree);Nectar-thick liquid Liquids provided via: Straw;Cup Medication Administration: Crushed with puree Supervision: Full supervision/cueing for compensatory strategies;Staff to assist with self feeding Compensations: Slow rate;Small sips/bites;Check for pocketing Postural Changes and/or Swallow Maneuvers: Seated upright 90 degrees;Upright 30-60 min after meal              Oral Care  Recommendations: Oral care BID Follow up Recommendations: Skilled Nursing facility Plan: Continue with current plan of care    GO    [facility] KingwoodBonnie Isatu Macinnes, MA CCC-SLP [phone]  Claudine MoutonDeBlois, [name] 03/22/2013, 1:23 PM

## 2013-03-22 NOTE — Progress Notes (Signed)
Subjective:  Patient has mildly improved overnight. She is able to respond verbally, but her responses lack meaning.   Objective: Vital signs in last 24 hours: Filed Vitals:   03/21/13 0700 03/21/13 1300 03/21/13 2053 03/22/13 0440  BP:  115/68 120/74 144/76  Pulse:  83 113 86  Temp:  98.1 F (36.7 C) 99.4 F (37.4 C) 98.4 F (36.9 C)  TempSrc:  Oral Axillary Axillary  Resp:  18 18 18   Height:      Weight: 98 lb 15.8 oz (44.9 kg)   95 lb 7.4 oz (43.3 kg)  SpO2:  100% 100% 98%   Weight change: -3 lb 8.4 oz (-1.6 kg)  Intake/Output Summary (Last 24 hours) at 03/22/13 1057 Last data filed at 03/21/13 1700  Gross per 24 hour  Intake      0 ml  Output      0 ml  Net      0 ml   Physical Exam  Constitutional: No distress.  HENT:  Mouth/Throat: Oropharynx is clear and moist.  Cardiovascular: Normal rate, regular rhythm, normal heart sounds and intact distal pulses.  Exam reveals no gallop and no friction rub.   No murmur heard. Pulmonary/Chest: Effort normal and breath sounds normal. No respiratory distress. She has no wheezes. She has no rales. She exhibits no tenderness.  Abdominal: Soft. Bowel sounds are normal. She exhibits no distension. There is no tenderness. There is no rebound and no guarding.  Neurological: She is alert.  Not oriented  Skin: She is not diaphoretic.    Lab Results: Basic Metabolic Panel:  Recent Labs Lab 03/19/13 0105  03/20/13 0400 03/21/13 0337  NA 162*  162*  < > 147 136*  K 3.4*  3.3*  < > 4.1 3.3*  CL 128*  128*  < > 113* 103  CO2 20  21  < > 19 18*  GLUCOSE 148*  151*  < > 122* 126*  BUN 57*  57*  < > 23 15  CREATININE 1.06  1.05  < > 0.85 0.83  CALCIUM 8.5  8.6  < > 8.3* 8.3*  MG 2.9*  --  2.5  --   PHOS 1.8*  --  2.4  --   < > = values in this interval not displayed. Liver Function Tests:  Recent Labs Lab 03/17/13 1354 03/19/13 0105 03/20/13 0400  AST 41*  --   --   ALT 41*  --   --   ALKPHOS 118*  --   --     BILITOT 0.5  --   --   PROT 8.4*  --   --   ALBUMIN 3.1* 2.4* 2.1*   CBC:  Recent Labs Lab 03/17/13 1354  WBC 23.9*  NEUTROABS 21.5*  HGB 11.6*  HCT 36.5  MCV 83.3  PLT 179   Cardiac Enzymes:  Recent Labs Lab 03/17/13 1354  TROPONINI <0.30   CBG:  Recent Labs Lab 03/17/13 2014 03/17/13 2333 03/18/13 0359 03/18/13 0616 03/18/13 0745  GLUCAP 159* 179* 167* 84 173*   Urine Drug Screen: Drugs of Abuse     Component Value Date/Time   LABOPIA NONE DETECTED 12/11/2006 0008   COCAINSCRNUR NONE DETECTED 12/11/2006 0008   LABBENZ NONE DETECTED 12/11/2006 0008   AMPHETMU NONE DETECTED 12/11/2006 0008   THCU NONE DETECTED 12/11/2006 0008   LABBARB  Value: NONE DETECTED        DRUG SCREEN FOR MEDICAL PURPOSES ONLY.  IF CONFIRMATION IS NEEDED FOR  ANY PURPOSE, NOTIFY LAB WITHIN 5 DAYS. 12/11/2006 0008    Alcohol Level: No results found for this basename: ETH,  in the last 168 hours Urinalysis:  Recent Labs Lab 03/17/13 1412 03/21/13 0836  COLORURINE AMBER* YELLOW  LABSPEC 1.027 1.014  PHURINE 5.0 5.5  GLUCOSEU NEGATIVE NEGATIVE  HGBUR SMALL* LARGE*  BILIRUBINUR SMALL* NEGATIVE  KETONESUR NEGATIVE NEGATIVE  PROTEINUR 30* 30*  UROBILINOGEN 1.0 1.0  NITRITE NEGATIVE POSITIVE*  LEUKOCYTESUR NEGATIVE LARGE*    Micro Results: Recent Results (from the past 240 hour(s))  URINE CULTURE     Status: None   Collection Time    03/17/13  2:12 PM      Result Value Ref Range Status   Specimen Description URINE, CATHETERIZED   Final   Special Requests NONE   Final   Culture  Setup Time     Final   Value: 03/17/2013 14:37     Performed at [facility] Count     Final   Value: NO GROWTH     Performed at [facility]   Culture     Final   Value: NO GROWTH     Performed at [facility]   Report Status 03/18/2013 FINAL   Final  MRSA PCR SCREENING     Status: None   Collection Time    03/17/13  4:50 PM      Result Value Ref Range  Status   MRSA by PCR NEGATIVE  NEGATIVE Final   Comment:            The GeneXpert MRSA Assay (FDA     approved for NASAL specimens     only), is one component of a     comprehensive MRSA colonization     surveillance program. It is not     intended to diagnose MRSA     infection nor to guide or     monitor treatment for     MRSA infections.  URINE CULTURE     Status: None   Collection Time    03/21/13  8:36 AM      Result Value Ref Range Status   Specimen Description URINE, RANDOM   Final   Special Requests Normal   Final   Culture  Setup Time     Final   Value: 03/21/2013 13:50     Performed at [facility] Count     Final   Value: >=100,000 COLONIES/ML     Performed at [facility]   Culture     Final   Value: ESCHERICHIA COLI     Performed at [facility]   Report Status PENDING   Incomplete   Studies/Results: No results found. Medications: I have reviewed the patient's current medications. Scheduled Meds: . antiseptic oral rinse  15 mL Mouth Rinse q12n4p  . cefTRIAXone (ROCEPHIN)  IV  1 g Intravenous Q24H  . chlorhexidine  15 mL Mouth Rinse BID  . feeding supplement (ENSURE COMPLETE)  237 mL Oral BID BM  . heparin subcutaneous  5,000 Units Subcutaneous 3 times per day  . latanoprost  1 drop Left Eye QHS  . sodium chloride  3 mL Intravenous Q12H   Continuous Infusions: . sodium chloride 75 mL/hr at 03/21/13 1126   PRN Meds:.acetaminophen, alum & mag hydroxide-simeth, magnesium hydroxide, RESOURCE THICKENUP CLEAR Assessment/Plan: Principal Problem:   Hypernatremia Active Problems:   Dementia   Acute renal injury   Encephalopathy  UTI (urinary tract infection)  Hypernatremia:  Likely due to decrease PO intake of food and water. Corrected with D5W. Patient continues to not eat or drink.  - D/C D5W  - Start NS at 75 cc/hr   Dementia  I spoke with patients HCPOA Mammie Russian: [phone]) on 3/2. She was unable to answer the  phone on 3/3. She had not spoken to patient in at least 3 months. She states that she use to be able to talk and eat w/o problems. However, med records indicate that she has been poorly responsive to verbal communication since at least August 2014. The patients POA was abel to see the patient last night. Patient appears to be at or approaching baseline mental status, however, she continues to have poor feeding and appetite. - Consult palliative care to have goals of care discussion with POA.  Frequent falls, Left Tib/fib fracture:  Diagnosed with Tib/fib fracture in 03/05/13. Cast in place.  - Tylenol for pain (avoid narcotic medications given dementia)  - Patient has f/u with ortho according to nurse at ALF   UTI  Patient has likely UTI given UA positive for leuks, nitrite, and hg. As patient is not tolerating PO, will give ceftriaxone IV.  Culture positive for E Coli > 100k. Pending susceptability.  AKI  Etiology was likely prerenal given profound dehydration. Resolved.   Dispo: Disposition is deferred at this time, awaiting improvement of current medical problems.  Anticipated discharge in approximately 1-2 day(s).   The patient does have a current PCP (No Pcp Per Patient) and does not need an [facility] hospital follow-up appointment after discharge.  The patient does have transportation limitations that hinder transportation to clinic appointments.  .Services Needed at time of discharge: Y = Yes, Blank = No PT:   OT:   RN:   Equipment:   Other:     LOS: 5 days   Pleas Koch, MD 03/22/2013, 10:57 AM

## 2013-03-23 DIAGNOSIS — R4182 Altered mental status, unspecified: Secondary | ICD-10-CM

## 2013-03-23 LAB — BASIC METABOLIC PANEL
BUN: 14 mg/dL (ref 6–23)
BUN: 14 mg/dL (ref 6–23)
CALCIUM: 8.4 mg/dL (ref 8.4–10.5)
CALCIUM: 8.5 mg/dL (ref 8.4–10.5)
CO2: 15 meq/L — AB (ref 19–32)
CO2: 15 mEq/L — ABNORMAL LOW (ref 19–32)
CREATININE: 0.69 mg/dL (ref 0.50–1.10)
CREATININE: 0.63 mg/dL (ref 0.50–1.10)
Chloride: 118 mEq/L — ABNORMAL HIGH (ref 96–112)
Chloride: 120 mEq/L — ABNORMAL HIGH (ref 96–112)
GFR calc Af Amer: >90 mL/min (ref >90)
GFR calc non Af Amer: 75 mL/min — ABNORMAL LOW (ref >90)
GFR calc non Af Amer: 78 mL/min — ABNORMAL LOW (ref >90)
GFR, EST AFRICAN AMERICAN: 87 mL/min — AB (ref >90)
GLUCOSE: 105 mg/dL — AB (ref 70–99)
Glucose, Bld: 96 mg/dL (ref 70–99)
Potassium: 3.2 mEq/L — ABNORMAL LOW (ref 3.7–5.3)
Potassium: 4 mEq/L (ref 3.7–5.3)
Sodium: 150 mEq/L — ABNORMAL HIGH (ref 137–147)
Sodium: 152 mEq/L — ABNORMAL HIGH (ref 137–147)

## 2013-03-23 LAB — URINE CULTURE: Special Requests: NORMAL

## 2013-03-23 MED ORDER — DEXTROSE 5 % IV SOLN
INTRAVENOUS | Status: DC
  Administered 2013-03-23 – 2013-03-25 (×4): via INTRAVENOUS

## 2013-03-23 MED ORDER — POTASSIUM CHLORIDE 10 MEQ/100ML IV SOLN
10.0000 meq | INTRAVENOUS | Status: AC
  Administered 2013-03-23 (×4): 10 meq via INTRAVENOUS
  Filled 2013-03-23 (×4): qty 100

## 2013-03-23 NOTE — Progress Notes (Signed)
Internal Medicine Attending  Date: 03/23/2013  Patient name: Kayla RossettiMildred Harmon Medical record number: [id number] Date of birth: 1922-09-08 Age: 80 y.o. Gender: female  I saw and evaluated the patient, and discussed her care on A.M rounds with housestaff.  I reviewed the resident's note by Dr. Glendell DockerKomanski and I agree with the resident's findings and plans as documented in his note.

## 2013-03-23 NOTE — Consult Note (Signed)
Patient [id number] [name]      DOB: 24-Dec-1922      YYO:[id number]  Met with patient's Niece who is POA .  She is struggling with patient's decline.   Summary of Goals; full note to follow:  Continue antibiotics for tonight. Niece going to talk with other family but leaning toward hospice home with comfort feeding .  Niece will talk with me again in am .    Total time:  535 pm-640 pm   Greenly Rarick L. Lovena Le, MD MBA The Palliative Medicine Team at [facility] Phone: [phone] Pager: [phone]

## 2013-03-23 NOTE — Progress Notes (Signed)
Physical Therapy Treatment Patient Details Name: [name] MRN: [id number] DOB: [birth year] Today's Date: 03/23/2013 Time: [id number] PT Time Calculation (min): 39 min  PT Assessment / Plan / Recommendation  History of Present Illness [name] is a 80 year old female with PMH of Dementia, Dysphasia, wheel chair dependent.  She was sent to the Emergency department today from her SNF due to altered mental status.   Patient with h/o Rt AKA, and had fall with Lt tib/fib fractures 12 days pta - in short leg cast.   PT Comments   Found pt in a contractured position L LE.  No information on whether this was baseline positioning, but worked on releasing contracture to normalize sitting and positioning in bed.  Worked on sitting balance which remains poor.   Follow Up Recommendations  SNF     Does the patient have the potential to tolerate intense rehabilitation     Barriers to Discharge        Equipment Recommendations  None recommended by PT    Recommendations for Other Services    Frequency Min 2X/week   Progress towards PT Goals Progress towards PT goals: Progressing toward goals  Plan Current plan remains appropriate    Precautions / Restrictions Precautions Precautions: Fall Precaution Comments: h/o falls Required Braces or Orthoses: Other Brace/Splint Other Brace/Splint: Lt short leg cast   Pertinent Vitals/Pain     Mobility  Bed Mobility Overal bed mobility: Needs Assistance;+2 for physical assistance Bed Mobility: Supine to Sit;Sit to Supine Supine to sit: Total assist;+2 for physical assistance Sit to supine: Total assist;+2 for physical assistance General bed mobility comments: truncal assist; physical guidance    Exercises Other Exercises Other Exercises: P/aa/rom to R LE and extensive work to release L LE contracture at L knee.   PT Diagnosis:    PT Problem List:   PT Treatment Interventions:     PT Goals (current goals can now be  found in the care plan section) Acute Rehab PT Goals Patient Stated Goal: Unable to state PT Goal Formulation: Patient unable to participate in goal setting Time For Goal Achievement: 04/02/13 Potential to Achieve Goals: Fair  Visit Information  Last PT Received On: 03/23/13 Assistance Needed: +2 History of Present Illness: [name] is a 80 year old female with PMH of Dementia, Dysphasia, wheel chair dependent.  She was sent to the Emergency department today from her SNF due to altered mental status.   Patient with h/o Rt AKA, and had fall with Lt tib/fib fractures 12 days pta - in short leg cast.    Subjective Data  Subjective: I told you to stop now. Patient Stated Goal: Unable to state   Cognition  Cognition Arousal/Alertness: Lethargic Behavior During Therapy: Restless Overall Cognitive Status: No family/caregiver present to determine baseline cognitive functioning Area of Impairment: Orientation;Attention;Following commands;Awareness Orientation Level: Disoriented to;Place;Time;Situation Current Attention Level: Focused Following Commands:  (not follow commands) General Comments: Patient not following commands with increased time given.      Balance  Balance Overall balance assessment: Needs assistance Sitting-balance support: Bilateral upper extremity supported;Single extremity supported;Feet supported;Feet unsupported Sitting balance-Leahy Scale: Poor Sitting balance - Comments: much time spent  attaining EOB, truncal activation, balance and all while working to staighten L knee contracture over a >=20 minute time period  End of Session PT - End of Session Activity Tolerance: Patient limited by lethargy;Patient limited by pain;Other (comment) (decr ability to participate cognitively) Patient left: in bed;with call bell/phone within reach;with nursing/sitter in room;with  restraints reapplied Nurse Communication: Need for lift equipment   GP      Brennan Litzinger, Eliseo Gum 03/23/2013, 2:11 PM 03/23/2013  [place] Bing, PT [phone]  (pager)

## 2013-03-23 NOTE — Consult Note (Signed)
Patient [name]      DOB: [birth year]      WHQ:[id number]     Consult Note from the Palliative Medicine Team at [facility] Requested by:  Dr . Marveen Reeks al     PCP: No PCP Per Patient Reason for Consultation: Goals of care    Phone Number:None Related symptom recommendations Assessment of patients Current state: 80 yr old african [place] female admitted with altered mental status . Treated for UTI she continues to refuse to eat and drink.  I met with her Niece who agreed to be her POA for her father, the patient's brother.  She has not seen her in some time and so it was quite a shocked by her decline.  While Tacy Dura is upset that she is not doing well she understands that we are likely not going to improve Malissie's condition.  She is leaning toward hospice facility placement but wanted to talk with her brothers first. For now she request that we continue antibiotics and retalk in the am.   Goals of Care: 1.  Code Status: DNR   2. Scope of Treatment: Continue antibiotics, offer food but do not force feed.   4. Disposition: to be determined   3. Symptom Management:   1. Anxiety/Agitation: not an issue, she is pleasantly confused. 2. Pain: tylenol as needed, if phantom pain an issue could add neurontin but I don't think she will take po  4. Psychosocial: described as a very spunky woman.  Marcelyn always favored Tacy Dura because she was the only girl in the family.  5. Spiritual: Christian faith, spiritual care services offered        Patient Documents Completed or Given: Document Given Completed  Advanced Directives Pkt    MOST    DNR    Gone from My Sight    Hard Choices      Brief HPI: 80 yr old african [place] female with advanced dementia .  Admitted with altered mental status and treated for  UTI.  Patient continue to refuse food and was found to be FOBT positive.  She is too frail for GI investigation but may well have an underlying  source for her FTT, Anorexia and severe malnutrition. We were asked to assist with goals of care.   ROS: unable to ascertain due to severe dementia    PMH:  Past Medical History  Diagnosis Date  . Dementia   . Dysphasia   . Anemia   . Embolism   . Thrombosis   . Dysphasia      [id number] reviewed. No pertinent past surgical history. I have reviewed the [place] and SH and  If appropriate update it with new information. No Known Allergies Scheduled Meds: . antiseptic oral rinse  15 mL Mouth Rinse q12n4p  . cefTRIAXone (ROCEPHIN)  IV  1 g Intravenous Q24H  . chlorhexidine  15 mL Mouth Rinse BID  . feeding supplement (ENSURE COMPLETE)  237 mL Oral BID BM  . heparin subcutaneous  5,000 Units Subcutaneous 3 times per day  . latanoprost  1 drop Left Eye QHS  . sodium chloride  3 mL Intravenous Q12H   Continuous Infusions: . dextrose 75 mL/hr at 03/23/13 1816   PRN Meds:.acetaminophen, alum & mag hydroxide-simeth, magnesium hydroxide, RESOURCE THICKENUP CLEAR    BP 95/35  Pulse 97  Temp(Src) 98.8 F (37.1 C) (Oral)  Resp 26  Ht 5' 4"  (1.626 m)  Wt 44.2 kg (97 lb 7.1 oz)  BMI 16.72 kg/m2  SpO2 100%   PPS: 10 %  ( patient spits out all food) Fast stage 7   Intake/Output Summary (Last 24 hours) at 03/23/13 1830 Last data filed at 03/23/13 1816  Gross per 24 hour  Intake    920 ml  Output      2 ml  Net    918 ml   LBM: 3/3                   Physical Exam:  General: mildly intermittently agitated, right eye remains closed history of enucleation, niece does not know the history with this eye HEENT:  Left pupil round and reactive, mm dry, edentulous and I do not see lesion. Chest:   Decreased and clear anteriorly CVS: regular rate and rhythm S1, S2 Abdomen: soft, not tender, positive bowel sounds Ext: right AKA, stump well healed Neuro:not oriented to time place or person  Labs: CBC    Component Value Date/Time   WBC 23.9* 03/17/2013 1354   RBC 4.38 03/17/2013  1354   HGB 11.6* 03/17/2013 1354   HCT 36.5 03/17/2013 1354   PLT 179 03/17/2013 1354   MCV 83.3 03/17/2013 1354   MCH 26.5 03/17/2013 1354   MCHC 31.8 03/17/2013 1354   RDW 16.0* 03/17/2013 1354   LYMPHSABS 1.6 03/17/2013 1354   MONOABS 0.7 03/17/2013 1354   EOSABS 0.0 03/17/2013 1354   BASOSABS 0.1 03/17/2013 1354      CMP     Component Value Date/Time   NA 150* 03/23/2013 0730   K 3.2* 03/23/2013 0730   CL 118* 03/23/2013 0730   CO2 15* 03/23/2013 0730   GLUCOSE 96 03/23/2013 0730   BUN 14 03/23/2013 0730   CREATININE 0.69 03/23/2013 0730   CALCIUM 8.4 03/23/2013 0730   PROT 8.4* 03/17/2013 1354   ALBUMIN 2.1* 03/20/2013 0400   AST 41* 03/17/2013 1354   ALT 41* 03/17/2013 1354   ALKPHOS 118* 03/17/2013 1354   BILITOT 0.5 03/17/2013 1354   GFRNONAA 75* 03/23/2013 0730   GFRAA 87* 03/23/2013 0730    Chest Xray Reviewed/Impressions: COPD changes.  No acute abnormalities ; infiltrate at left base on previous exam  resolved.   CT scan of the Head Reviewed/Impressions: Stable exam. Advanced atrophy and chronic microvascular ischemic  change     Time In Time Out Total Time Spent with Patient Total Overall Time  530 pm 615 pm 45 min 45 min    Greater than 50%  of this time was spent counseling and coordinating care related to the above assessment and plan.    Chia Rock L. Lovena Le, MD MBA The Palliative Medicine Team at [facility] Phone: [phone] Pager: [phone]

## 2013-03-23 NOTE — Progress Notes (Signed)
Subjective:  Patients is alert and intermittently oriented to person.  Objective: Vital signs in last 24 hours: Filed Vitals:   03/22/13 0440 03/22/13 1300 03/22/13 2103 03/23/13 0438  BP: 144/76 112/65 128/72 118/63  Pulse: 86 64 122 105  Temp: 98.4 F (36.9 C) 98.7 F (37.1 C) 98.4 F (36.9 C) 98.3 F (36.8 C)  TempSrc: Axillary Axillary Oral Oral  Resp: 18 20 18 18   Height:      Weight: 95 lb 7.4 oz (43.3 kg)   97 lb 7.1 oz (44.2 kg)  SpO2: 98% 96% 99% 100%   Weight change: 1 lb 15.7 oz (0.9 kg)  Intake/Output Summary (Last 24 hours) at 03/23/13 1136 Last data filed at 03/23/13 1057  Gross per 24 hour  Intake    150 ml  Output      3 ml  Net    147 ml   Physical Exam  Constitutional: No distress.  Cachectic elderly women  HENT:  Mouth/Throat: Oropharynx is clear and moist.  Cardiovascular: Normal rate, regular rhythm, normal heart sounds and intact distal pulses.  Exam reveals no gallop and no friction rub.   No murmur heard. Pulmonary/Chest: Effort normal and breath sounds normal. No respiratory distress. She has no wheezes. She has no rales. She exhibits no tenderness.  Abdominal: Soft. Bowel sounds are normal. She exhibits no distension. There is no tenderness. There is no rebound and no guarding.  Musculoskeletal:  Right leg amputation and left lower leg cast.  Neurological: She is alert.  Not oriented  Skin: She is not diaphoretic.    Lab Results: Basic Metabolic Panel:  Recent Labs Lab 03/19/13 0105  03/20/13 0400  03/22/13 1205 03/23/13 0730  NA 162*  162*  < > 147  < > 147 150*  K 3.4*  3.3*  < > 4.1  < > 3.2* 3.2*  CL 128*  128*  < > 113*  < > 113* 118*  CO2 20  21  < > 19  < > 18* 15*  GLUCOSE 148*  151*  < > 122*  < > 107* 96  BUN 57*  57*  < > 23  < > 16 14  CREATININE 1.06  1.05  < > 0.85  < > 0.71 0.69  CALCIUM 8.5  8.6  < > 8.3*  < > 8.2* 8.4  MG 2.9*  --  2.5  --   --   --   PHOS 1.8*  --  2.4  --   --   --   < > = values  in this interval not displayed. Liver Function Tests:  Recent Labs Lab 03/17/13 1354 03/19/13 0105 03/20/13 0400  AST 41*  --   --   ALT 41*  --   --   ALKPHOS 118*  --   --   BILITOT 0.5  --   --   PROT 8.4*  --   --   ALBUMIN 3.1* 2.4* 2.1*   CBC:  Recent Labs Lab 03/17/13 1354  WBC 23.9*  NEUTROABS 21.5*  HGB 11.6*  HCT 36.5  MCV 83.3  PLT 179   Cardiac Enzymes:  Recent Labs Lab 03/17/13 1354  TROPONINI <0.30   CBG:  Recent Labs Lab 03/17/13 2014 03/17/13 2333 03/18/13 0359 03/18/13 0616 03/18/13 0745  GLUCAP 159* 179* 167* 84 173*   Urine Drug Screen: Drugs of Abuse     Component Value Date/Time   LABOPIA NONE DETECTED 12/11/2006 0008   COCAINSCRNUR  NONE DETECTED 12/11/2006 0008   LABBENZ NONE DETECTED 12/11/2006 0008   AMPHETMU NONE DETECTED 12/11/2006 0008   THCU NONE DETECTED 12/11/2006 0008   LABBARB  Value: NONE DETECTED        DRUG SCREEN FOR MEDICAL PURPOSES ONLY.  IF CONFIRMATION IS NEEDED FOR ANY PURPOSE, NOTIFY LAB WITHIN 5 DAYS. 12/11/2006 0008    Alcohol Level: No results found for this basename: ETH,  in the last 168 hours Urinalysis:  Recent Labs Lab 03/17/13 1412 03/21/13 0836  COLORURINE AMBER* YELLOW  LABSPEC 1.027 1.014  PHURINE 5.0 5.5  GLUCOSEU NEGATIVE NEGATIVE  HGBUR SMALL* LARGE*  BILIRUBINUR SMALL* NEGATIVE  KETONESUR NEGATIVE NEGATIVE  PROTEINUR 30* 30*  UROBILINOGEN 1.0 1.0  NITRITE NEGATIVE POSITIVE*  LEUKOCYTESUR NEGATIVE LARGE*    Micro Results: Recent Results (from the past 240 hour(s))  URINE CULTURE     Status: None   Collection Time    03/17/13  2:12 PM      Result Value Ref Range Status   Specimen Description URINE, CATHETERIZED   Final   Special Requests NONE   Final   Culture  Setup Time     Final   Value: 03/17/2013 14:37     Performed at [facility] Count     Final   Value: NO GROWTH     Performed at [facility]   Culture     Final   Value: NO GROWTH      Performed at [facility]   Report Status 03/18/2013 FINAL   Final  MRSA PCR SCREENING     Status: None   Collection Time    03/17/13  4:50 PM      Result Value Ref Range Status   MRSA by PCR NEGATIVE  NEGATIVE Final   Comment:            The GeneXpert MRSA Assay (FDA     approved for NASAL specimens     only), is one component of a     comprehensive MRSA colonization     surveillance program. It is not     intended to diagnose MRSA     infection nor to guide or     monitor treatment for     MRSA infections.  URINE CULTURE     Status: None   Collection Time    03/21/13  8:36 AM      Result Value Ref Range Status   Specimen Description URINE, RANDOM   Final   Special Requests Normal   Final   Culture  Setup Time     Final   Value: 03/21/2013 13:50     Performed at [facility] Count     Final   Value: >=100,000 COLONIES/ML     Performed at [facility]   Culture     Final   Value: ESCHERICHIA COLI     Performed at [facility]   Report Status 03/23/2013 FINAL   Final   Organism ID, Bacteria ESCHERICHIA COLI   Final   Studies/Results: No results found. Medications: I have reviewed the patient's current medications. Scheduled Meds: . antiseptic oral rinse  15 mL Mouth Rinse q12n4p  . cefTRIAXone (ROCEPHIN)  IV  1 g Intravenous Q24H  . chlorhexidine  15 mL Mouth Rinse BID  . feeding supplement (ENSURE COMPLETE)  237 mL Oral BID BM  . heparin subcutaneous  5,000 Units Subcutaneous 3 times per day  .  latanoprost  1 drop Left Eye QHS  . potassium chloride  10 mEq Intravenous Q2H  . sodium chloride  3 mL Intravenous Q12H   Continuous Infusions: . dextrose 75 mL/hr at 03/23/13 1056   PRN Meds:.acetaminophen, alum & mag hydroxide-simeth, magnesium hydroxide, RESOURCE THICKENUP CLEAR Assessment/Plan: Principal Problem:   Hypernatremia Active Problems:   Dementia   Acute renal injury   Encephalopathy   UTI (urinary tract  infection)  Hypernatremia:  Likely due to decrease PO intake of food and water. Initially was corrected with D5W. Patient continues to not eat or drink. Thus, she was transitioned to NS at 75 cc/hr. However, she developed hypernatremia likely due to poor oral intake.  -Restart D5W at 75 cc/hr and f/u BMP at 6 pm.  Dementia  I spoke with patients HCPOA Mammie Russian(Kevia Grant: [phone]) on 3/2. She was unable to answer the phone on 3/3. She had not spoken to patient in at least 3 months. She states that the patient use to be able to talk and eat w/o problems. However, med records indicate that she has been poorly responsive to verbal communication since at least August 2014. The patients POA was able to see the patient last night. Patient appears to be at or approaching baseline mental status, however, she continues to have poor feeding and appetite. - Consult palliative care to have goals of care discussion with POA.  Frequent falls, Left Tib/fib fracture:  Diagnosed with Tib/fib fracture in 03/05/13. Cast in place.  - Tylenol for pain (avoid narcotic medications given dementia and AMS at presentation)  - Patient has f/u with ortho according to nurse at ALF   UTI  Patient has UTI. As patient is not tolerating PO, will give ceftriaxone IV.  Culture positive for E Coli > 100k. Pending susceptability.  AKI  Etiology was likely prerenal given profound dehydration. Resolved.  Dispo: Disposition is deferred at this time, awaiting improvement of current medical problems.  Anticipated discharge in approximately 1-2 day(s).   The patient does have a current PCP (No Pcp Per Patient) and [facility] after discharge.  The patient does have transportation limitations that hinder transportation to clinic appointments.  .Services Needed at time of discharge: Y = Yes, Blank = No PT:   OT:   RN:   Equipment:   Other:     LOS: 6 days   Pleas Kochhristopher Brennin Durfee,  MD 03/23/2013, 11:36 AM

## 2013-03-24 DIAGNOSIS — Z515 Encounter for palliative care: Secondary | ICD-10-CM

## 2013-03-24 DIAGNOSIS — E860 Dehydration: Secondary | ICD-10-CM

## 2013-03-24 LAB — CBC WITH DIFFERENTIAL/PLATELET
Basophils Absolute: 0 10*3/uL (ref 0.0–0.1)
Basophils Relative: 0 % (ref 0–1)
EOS ABS: 0 10*3/uL (ref 0.0–0.7)
Eosinophils Relative: 0 % (ref 0–5)
HCT: 18.7 % — ABNORMAL LOW (ref 36.0–46.0)
Hemoglobin: 6.3 g/dL — CL (ref 12.0–15.0)
Lymphocytes Relative: 7 % — ABNORMAL LOW (ref 12–46)
Lymphs Abs: 1.7 10*3/uL (ref 0.7–4.0)
MCH: 26.7 pg (ref 26.0–34.0)
MCHC: 33.7 g/dL (ref 30.0–36.0)
MCV: 79.2 fL (ref 78.0–100.0)
MONO ABS: 1 10*3/uL (ref 0.1–1.0)
Monocytes Relative: 4 % (ref 3–12)
Neutro Abs: 21.3 10*3/uL — ABNORMAL HIGH (ref 1.7–7.7)
Neutrophils Relative %: 89 % — ABNORMAL HIGH (ref 43–77)
PLATELETS: 373 10*3/uL (ref 150–400)
RBC: 2.36 MIL/uL — AB (ref 3.87–5.11)
RDW: 16.6 % — AB (ref 11.5–15.5)
WBC: 24 10*3/uL — ABNORMAL HIGH (ref 4.0–10.5)

## 2013-03-24 LAB — BASIC METABOLIC PANEL
BUN: 11 mg/dL (ref 6–23)
CALCIUM: 8.6 mg/dL (ref 8.4–10.5)
CO2: 18 mEq/L — ABNORMAL LOW (ref 19–32)
Chloride: 115 mEq/L — ABNORMAL HIGH (ref 96–112)
Creatinine, Ser: 0.59 mg/dL (ref 0.50–1.10)
GFR calc Af Amer: >90 mL/min (ref >90)
GFR, EST NON AFRICAN AMERICAN: 79 mL/min — AB (ref >90)
Glucose, Bld: 130 mg/dL — ABNORMAL HIGH (ref 70–99)
Potassium: 3.4 mEq/L — ABNORMAL LOW (ref 3.7–5.3)
SODIUM: 147 meq/L (ref 137–147)

## 2013-03-24 MED ORDER — HEPARIN SODIUM (PORCINE) 5000 UNIT/ML IJ SOLN: 5000.0000 [IU] | Freq: Three times a day (TID) | INTRAMUSCULAR | Status: DC

## 2013-03-24 NOTE — Progress Notes (Signed)
CSW received consult for residential hospice placement. CSW called patient's family Tamela OddiKevia and spoke to her about residential hospice. Tamela OddiKevia states [facility].CSW [facility] and left a Engineer, technical salesvoicemail for Carley Hammedva, Child psychotherapistsocial worker at [facility].   Maree KrabbeLindsay Alvaro Aungst, MSW, Theresia MajorsLCSWA [phone]

## 2013-03-24 NOTE — Progress Notes (Signed)
Patient ZO:XWRUEAV:[name]      DOB: [birth year]      WUJ:[id number]   [facility] Progress Note    Subjective: Kayla CroftMildred remains none verbal but will shake head yes and no.  Spoke with Kayla OddiKevia she has decided to elect hospice care and is ok with stopping curative treatments in lieu of comfort care with transition to hospice home. Noted hemoglobin drop and shared this with [name]. Transfusion will not change the outcome and FOBT + supports possible GI source for sever malnutrition, anorexia, and failure to thrive.     Filed Vitals:   03/24/13 1328  BP: 94/42  Pulse: 93  Temp: 98.5 F (36.9 C)  Resp: 18   Physical exam:  General: no change, awake, alert but confused speech Pupil on left round and reactive, right eye is sunken and shut unclear if injury in past Chest : decreased but clear CVS: regular , S1, S2 Abd: scaphoid not tender Ext: right leg above the knee amputation, clean and dry Neuro: speaks non sense, can shake head yes and no  Lab Results  Component Value Date   CREATININE 0.59 03/24/2013   BUN 11 03/24/2013   NA 147 03/24/2013   K 3.4* 03/24/2013   CL 115* 03/24/2013   CO2 18* 03/24/2013    Lab Results  Component Value Date   WBC 24.0* 03/24/2013   HGB 6.3* 03/24/2013   HCT 18.7* 03/24/2013   MCV 79.2 03/24/2013   PLT 373 03/24/2013     Assessment and plan: 80 yr old  African [place] female with advanced dementia, uti, and questionable GI issue- FOBT, hemoglobin dropping.  POA has elected consideration for hospice facility placement.    1.  DNR  2.  Comfort feed . Do not force  3.  D/c Abx at discharge  4.  D/c labs and no blood transfusions.  Awaiting call back from Primary service to update.  Have placed a social work consult for referral to hospice.  Kayla Christianson L. Ladona Ridgelaylor, MD MBA The Palliative Medicine Team at [facility] Team Phone: [phone] Pager: [phone]  Total time:  25 min 235-300 pm

## 2013-03-24 NOTE — Progress Notes (Signed)
Chaplain now has the privilege of rendering pastoral care to palliative patients.  Chaplain observed pt resting on her side and with her eyes slightly open.  Pt began to move some when chaplain spoke to her but never verbally responded to chaplain's voice.     03/24/13 1600  Clinical Encounter Type  Visited With Patient  Visit Type Spiritual support   Rulon Abideavid B Sherrod, chaplain pager [phone]

## 2013-03-24 NOTE — Progress Notes (Signed)
CRITICAL VALUE ALERT  Critical value received:  Hemoglobin  Date of notification:  03/24/13  Time of notification:  0450  Critical value read back:yes  Nurse who received alert:  Clovis FredricksonHayley Shelton   MD notified (1st page):  Wilson Time of first page:  [phone]  Responding MD:  Andrey CampanileWilson  Time MD responded:  [phone]

## 2013-03-24 NOTE — Progress Notes (Signed)
S:  Paged by nurse around 5AM due to critical value alert, Hgb 6.3.  Patient with dementia but behavior has not changed per nursing staff.  No gross blood loss.  Vital stable.  O: vitals review - BP 105/18, HR 101, 94% on RA General:  Awake, in NAD Cardiac:  RRR, no r/m/g Pulm:  CTA B/L GI:  +BS, soft, NT Ext:  Left leg in cast (s/p left tib/fib fracture)  A/P:  Hgb 6.3 down from 11.6 on day of admission.  No evidence of gross blood loss.  Palliative involved and goals of care discussion initiated with POA yesterday, who will speak with other family and make decision today.  I attempted to call POA regarding desire to transfuse but unable to reach by phone. - patient stable, no active bleeding will defer to day team as treatment plans will depend on goals of care.  Evelena PeatAlex Aeliana Spates DO

## 2013-03-24 NOTE — Progress Notes (Signed)
Subjective:  Patient Hg was found to be 6.3 this am. Patient is asymptomatic at this time. She continues to be alert but is unable to appropriately participate in coversation. Her HCPOA met with Dr. Lovena Le in palliative care last night. The POA decided to talk with other family members prior to making a decision regarding transfer to hospice care.   Objective: Vital signs in last 24 hours: Filed Vitals:   03/23/13 1445 03/23/13 2119 03/24/13 0500 03/24/13 0527  BP: 95/35 126/56  105/48  Pulse: 97 84  101  Temp: 98.8 F (37.1 C) 98.4 F (36.9 C)  99.2 F (37.3 C)  TempSrc: Oral Oral  Oral  Resp: 26 16  20   Height:      Weight:   98 lb 1.7 oz (44.5 kg)   SpO2: 100% 97%  94%   Weight change: 10.6 oz (0.3 kg)  Intake/Output Summary (Last 24 hours) at 03/24/13 0949 Last data filed at 03/24/13 9390  Gross per 24 hour  Intake    870 ml  Output      2 ml  Net    868 ml   Physical Exam  Constitutional:  Cachectic elderly female  Cardiovascular: Normal rate, regular rhythm, normal heart sounds and intact distal pulses.  Exam reveals no friction rub.   No murmur heard. Pulmonary/Chest: Effort normal and breath sounds normal. No respiratory distress. She has no wheezes. She has no rales.  Abdominal: Soft. Bowel sounds are normal. She exhibits no distension. There is no tenderness. There is no rebound and no guarding.  Neurological: She is alert.  Not oriented    Lab Results: Basic Metabolic Panel:  Recent Labs Lab 03/19/13 0105  03/20/13 0400  03/23/13 1830 03/24/13 0725  NA 162*  162*  < > 147  < > 152* 147  K 3.4*  3.3*  < > 4.1  < > 4.0 3.4*  CL 128*  128*  < > 113*  < > 120* 115*  CO2 20  21  < > 19  < > 15* 18*  GLUCOSE 148*  151*  < > 122*  < > 105* 130*  BUN 57*  57*  < > 23  < > 14 11  CREATININE 1.06  1.05  < > 0.85  < > 0.63 0.59  CALCIUM 8.5  8.6  < > 8.3*  < > 8.5 8.6  MG 2.9*  --  2.5  --   --   --   PHOS 1.8*  --  2.4  --   --   --   < > =  values in this interval not displayed. Liver Function Tests:  Recent Labs Lab 03/17/13 1354 03/19/13 0105 03/20/13 0400  AST 41*  --   --   ALT 41*  --   --   ALKPHOS 118*  --   --   BILITOT 0.5  --   --   PROT 8.4*  --   --   ALBUMIN 3.1* 2.4* 2.1*   CBC:  Recent Labs Lab 03/17/13 1354 03/24/13 0310  WBC 23.9* 24.0*  NEUTROABS 21.5* 21.3*  HGB 11.6* 6.3*  HCT 36.5 18.7*  MCV 83.3 79.2  PLT 179 373   Cardiac Enzymes:  Recent Labs Lab 03/17/13 1354  TROPONINI <0.30   CBG:  Recent Labs Lab 03/17/13 2014 03/17/13 2333 03/18/13 0359 03/18/13 0616 03/18/13 0745  GLUCAP 159* 179* 167* 84 173*   Urine Drug Screen: Drugs of Abuse  Component Value Date/Time   LABOPIA NONE DETECTED 12/11/2006 0008   COCAINSCRNUR NONE DETECTED 12/11/2006 0008   LABBENZ NONE DETECTED 12/11/2006 0008   AMPHETMU NONE DETECTED 12/11/2006 0008   THCU NONE DETECTED 12/11/2006 0008   LABBARB  Value: NONE DETECTED        DRUG SCREEN FOR MEDICAL PURPOSES ONLY.  IF CONFIRMATION IS NEEDED FOR ANY PURPOSE, NOTIFY LAB WITHIN 5 DAYS. 12/11/2006 0008    Urinalysis:  Recent Labs Lab 03/17/13 1412 03/21/13 0836  COLORURINE AMBER* YELLOW  LABSPEC 1.027 1.014  PHURINE 5.0 5.5  GLUCOSEU NEGATIVE NEGATIVE  HGBUR SMALL* LARGE*  BILIRUBINUR SMALL* NEGATIVE  KETONESUR NEGATIVE NEGATIVE  PROTEINUR 30* 30*  UROBILINOGEN 1.0 1.0  NITRITE NEGATIVE POSITIVE*  LEUKOCYTESUR NEGATIVE LARGE*   Micro Results: Recent Results (from the past 240 hour(s))  URINE CULTURE     Status: None   Collection Time    03/17/13  2:12 PM      Result Value Ref Range Status   Specimen Description URINE, CATHETERIZED   Final   Special Requests NONE   Final   Culture  Setup Time     Final   Value: 03/17/2013 14:37     Performed at [facility]     Final   Value: NO GROWTH     Performed at [facility]   Culture     Final   Value: NO GROWTH     Performed at [facility]   Report Status 03/18/2013 FINAL   Final  MRSA PCR SCREENING     Status: None   Collection Time    03/17/13  4:50 PM      Result Value Ref Range Status   MRSA by PCR NEGATIVE  NEGATIVE Final   Comment:            The GeneXpert MRSA Assay (FDA     approved for NASAL specimens     only), is one component of a     comprehensive MRSA colonization     surveillance program. It is not     intended to diagnose MRSA     infection nor to guide or     monitor treatment for     MRSA infections.  URINE CULTURE     Status: None   Collection Time    03/21/13  8:36 AM      Result Value Ref Range Status   Specimen Description URINE, RANDOM   Final   Special Requests Normal   Final   Culture  Setup Time     Final   Value: 03/21/2013 13:50     Performed at [facility]     Final   Value: >=100,000 COLONIES/ML     Performed at [facility]   Culture     Final   Value: ESCHERICHIA COLI     Performed at [facility]   Report Status 03/23/2013 FINAL   Final   Organism ID, Bacteria ESCHERICHIA COLI   Final   Studies/Results: No results found. Medications: I have reviewed the patient's current medications. Scheduled Meds: . antiseptic oral rinse  15 mL Mouth Rinse q12n4p  . cefTRIAXone (ROCEPHIN)  IV  1 g Intravenous Q24H  . chlorhexidine  15 mL Mouth Rinse BID  . feeding supplement (ENSURE COMPLETE)  237 mL Oral BID BM  . heparin subcutaneous  5,000 Units Subcutaneous 3 times per day  . latanoprost  1 drop Left Eye QHS  . sodium chloride  3 mL Intravenous Q12H   Continuous Infusions: . dextrose 75 mL/hr at 03/24/13 0429   PRN Meds:.acetaminophen, alum & mag hydroxide-simeth, magnesium hydroxide, RESOURCE THICKENUP CLEAR Assessment/Plan: Principal Problem:   Hypernatremia Active Problems:   Dementia   Acute renal injury   Encephalopathy   UTI (urinary tract infection)  Anemia Patient has anemia, Hg 6.3. This is likely multifactorial.  First patient received large IVFs during admission which likely caused some degree of dilution. Second, patient has had poor PO intake and the anemia is microcytic, making iron deficiency most likely. Certainly other nutritional deficiencies are possible (eg folate B12). Third, patient had postive FOBT on admission, likely representing a slow GI bleed. There are no signs of acute hemorrhage. The patient is asymptomatic. I attempted to contact the POA to discuss the risks and benefits of blood tranfusion and further work up of the patients anemia. The family member did not answer her telephone.  I spoke with Dr. Lovena Le, palliative care, regarding the patients case. Dr. Lovena Le will be talking with POA later today to make final decision regarding transition to hospice. If I am unable to speak with POA before this, Dr. Lovena Le will also address the risks and benefits of blood transfusion in this patient.  - trend CBC daily - consider transfusion once talked with POA - no indicate for emergent transfusion at this time.  Hypernatremia:  Likely due to decrease PO intake of food and water. Initially was corrected with D5W. Patient continues to not eat or drink. Thus, she was transitioned to NS at 75 cc/hr. However, she developed hypernatremia again  likely due to poor oral intake.  -Continue D5W at 75 cc/hr  Severe Dementia  Patient continues to fail to eat, even when prompted by staff. Palliative care met with POA to discuss transition to hospice. Expect a final decision in the AM.  Frequent falls, Left Tib/fib fracture:  Diagnosed with Tib/fib fracture in 03/05/13. Cast in place.  - Tylenol for pain (avoid narcotic medications given dementia and AMS at presentation)  - Patient has f/u with ortho according to nurse at ALF   UTI  Patient has UTI. As patient is not tolerating PO, will give ceftriaxone IV. Culture positive for E Coli > 100k. Pan-sensitive. - Continue IV ceftriaxone  AKI  Etiology was  likely prerenal given profound dehydration. Resolved.   Dispo: Disposition is deferred at this time, awaiting improvement of current medical problems.  Anticipated discharge in approximately 1 day(s).   The patient does have a current PCP (No Pcp Per Patient) and does not need an [facility] hospital follow-up appointment after discharge.  The patient does have transportation limitations that hinder transportation to clinic appointments.  .Services Needed at time of discharge: Y = Yes, Blank = No PT:   OT:   RN:   Equipment:   Other:     LOS: 7 days   Marrion Coy, MD 03/24/2013, 9:49 AM

## 2013-03-25 DIAGNOSIS — D649 Anemia, unspecified: Secondary | ICD-10-CM

## 2013-03-25 DIAGNOSIS — N179 Acute kidney failure, unspecified: Secondary | ICD-10-CM

## 2013-03-25 DIAGNOSIS — G9340 Encephalopathy, unspecified: Secondary | ICD-10-CM

## 2013-03-25 NOTE — Progress Notes (Signed)
Physical Therapy Discharge Patient Details Name: Kayla RossettiMildred Harmon MRN: [id number] DOB: Jun 25, 1922 Today's Date: 03/25/2013 Time:  -     Patient discharged from PT services secondary to order from MD.  Decline in status..  Please see latest therapy progress note for current level of functioning and progress toward goals.    Progress and discharge plan discussed with patient and/or caregiver: Pt to be d/c to residential hospice.  GP     Fumiko Cham LUBECK 03/25/2013, 11:35 AM

## 2013-03-25 NOTE — Progress Notes (Signed)
PT Cancellation Note  Patient Details Name: Kayla RossettiMildred Harmon MRN: [id number] DOB: 30-Jan-1922   Cancelled Treatment:    Reason Eval/Treat Not Completed: Medical issues which prohibited therapy (Hgb 6.3).  Pt may be d/c to residential hospice today.   Jibri Schriefer LUBECK 03/25/2013, 11:05 AM

## 2013-03-25 NOTE — Progress Notes (Signed)
Internal Medicine Attending  Date: 03/25/2013  Patient name: Kayla RossettiMildred Harmon Medical record number: [id number] Date of birth: April 11, 1922 Age: 80 y.o. Gender: female  I saw and evaluated the patient, and discussed her care on A.M rounds with housestaff.  I reviewed the resident's note by Dr. Glendell DockerKomanski and I agree with the resident's findings and plans as documented in his note.

## 2013-03-25 NOTE — Progress Notes (Signed)
Clinical Social Worker facilitated patient discharge by contacting the family and facility, [facility]. Patient's family member, Tamela OddiKevia agreeable to this plan and arranging transport via EMS . CSW will sign off, as social work intervention is no longer needed.  Maree KrabbeLindsay Shere Eisenhart, MSW, Theresia MajorsLCSWA [phone]

## 2013-03-25 NOTE — Progress Notes (Addendum)
11:40 AM: Patient's family member is able to find a fax machine and have paperwork faxed over and fill them out for possible dc today.  Update: CSW received phone call from [facility], stating that Tamela OddiKevia, patient's family member cannot meet with hospice facility today and so they are not able to accept patient today until family can sign paperwork. DC anticipated tomorrow. CSW will update MD.  CSW received call from [facility] and they do have availability today. They are coming to assess patient and speak with patient's family and then will contact social worker.  Maree KrabbeLindsay Rickeya Harmon, MSW, Theresia MajorsLCSWA [phone]

## 2013-03-25 NOTE — Discharge Summary (Signed)
Name: [name] MRN: [id number] DOB: [birth year] 80 y.o. PCP: No Pcp Per Patient  Date of Admission: 03/17/2013  1:23 PM Date of Discharge: 03/25/2013 Attending Physician: Axel Filler, MD  Discharge Diagnosis:  Principal Problem:   Hypernatremia Active Problems:   Dementia   Acute renal injury   Encephalopathy   UTI (urinary tract infection)  Discharge Medications:   Medication List    STOP taking these medications       alum & mag hydroxide-simeth 200-200-20 MG/5ML suspension  Commonly known as:  MAALOX/MYLANTA     ANTI-DIARRHEAL 2 MG capsule  Generic drug:  loperamide     aspirin 325 MG tablet     guaiFENesin 100 MG/5ML Soln  Commonly known as:  ROBITUSSIN     LORazepam 0.5 MG tablet  Commonly known as:  ATIVAN     magnesium hydroxide 400 MG/5ML suspension  Commonly known as:  MILK OF MAGNESIA     mirtazapine 15 MG tablet  Commonly known as:  REMERON     OVER THE COUNTER MEDICATION     oxyCODONE 5 MG immediate release tablet  Commonly known as:  Oxy IR/ROXICODONE     potassium chloride 20 MEQ/15ML (10%) solution     sennosides-docusate sodium 8.6-50 MG tablet  Commonly known as:  SENOKOT-S     Vitamin D (Ergocalciferol) 50000 UNITS Caps capsule  Commonly known as:  DRISDOL      TAKE these medications       acetaminophen 500 MG tablet  Commonly known as:  TYLENOL  Take 500 mg by mouth every 6 (six) hours as needed for pain.     latanoprost 0.005 % ophthalmic solution  Commonly known as:  XALATAN  Place 1 drop into the left eye at bedtime.        Disposition and follow-up:   Ms.[name] was discharged from [facility] in Serious condition.  At the hospital follow up visit please address:  1.  Comfort  2.  Labs / imaging needed at time of follow-up: None  3.  Pending labs/ test needing follow-up: None.  Follow-up Appointments: None.   Discharge Instructions: Patient's POA has  selected comfort care.   Consultations: Treatment Team:  Palliative [facility]  Procedures Performed:  Dg Femur Left  03/05/2013   CLINICAL DATA:  Left leg pain.  EXAM: LEFT FEMUR - 2 VIEW  COMPARISON:  No priors.  FINDINGS: Four views of the left femur are exceedingly limited in evaluation of the proximal femur secondary to underpenetration of the images and nonstandard views. With these limitations in mind, no definite acute displaced femur fractures are noted. Numerous vascular calcifications are noted.  IMPRESSION: 1. Exceedingly limited examination demonstrating no definite acute displaced femur fracture.   Electronically Signed   By: Vinnie Langton M.D.   On: 03/05/2013 15:30   Dg Tibia/fibula Left  03/05/2013   CLINICAL DATA:  Left leg pain.  EXAM: LEFT TIBIA AND FIBULA - 2 VIEW  COMPARISON:  None.  FINDINGS: Diffuse osteopenia is noted. Nondisplaced oblique fracture of the distal tibia is noted, as well as nondisplaced oblique fracture involving distal fibula.  IMPRESSION: Nondisplaced fracture seen involving the distal left tibia and fibula.   Electronically Signed   By: Sabino Dick M.D.   On: 03/05/2013 15:22   Ct Head Wo Contrast  03/17/2013   CLINICAL DATA:  Altered mental status.  Recent fall.  Dementia.  EXAM: CT HEAD WITHOUT CONTRAST  TECHNIQUE: Contiguous axial images were  obtained from the base of the skull through the vertex without contrast.  COMPARISON:  CT HEAD W/O CM dated 02/07/2013; CT C SPINE W/O CM dated 09/08/2012; CT HEAD W/O CM dated 09/08/2012  FINDINGS: Advanced atrophy with chronic microvascular ischemic change. No evidence for acute infarction, hemorrhage, mass lesion, hydrocephalus, or extra-axial fluid. Advanced vascular calcification. No sinus or mastoid fluid. Moderate cerumen fills both ears. There is no skull fracture.  IMPRESSION: Stable exam. Advanced atrophy and chronic microvascular ischemic change.   Electronically Signed   By: Rolla Flatten M.D.   On:  03/17/2013 15:09   Dg Chest Portable 1 View  03/17/2013   CLINICAL DATA:  Altered mental status  EXAM: PORTABLE CHEST - 1 VIEW  COMPARISON:  Portable exam 1510 hr compared 09/09/2012  FINDINGS: Normal heart size, mediastinal contours, and pulmonary vascularity.  Atherosclerotic calcification aorta.  Emphysematous changes consistent with COPD.  Skin fold projects over right chest.  No acute infiltrate, pleural effusion or pneumothorax.  Bones demineralized.  IMPRESSION: COPD changes.  No acute abnormalities ; infiltrate at left base on previous exam resolved.   Electronically Signed   By: Lavonia Dana M.D.   On: 03/17/2013 15:21   Dg Foot Complete Left  03/05/2013   CLINICAL DATA:  Left leg pain.  EXAM: LEFT FOOT - COMPLETE 3+ VIEW  COMPARISON:  None.  FINDINGS: Diffuse osteopenia is noted. No fracture or dislocation is noted. Joint spaces are intact.  IMPRESSION: No acute abnormality seen in the left foot.   Electronically Signed   By: Sabino Dick M.D.   On: 03/05/2013 15:25    Admission HPI:   [name] is a 80 year old female with PMH of Dementia, Dysphasia, wheel chair dependent. She was sent to the Emergency department today from her SNF due to altered mental status. Per nursing home staff patient has had a decline in functional status since her Left Tib/fib fracture 12 days ago. She has had decreased PO intake and become more lethargic (especially after introduction of oxycodone). Today at lunchtime she became unresponsive, cold and clammy but still with pulse and respirations. EMS responded and placed her on supplemental O2 and she became slightly more responsive. In the ED she was found to be hypernatremic, and IMTS was asked to admit.   Per SNF nursing: patient has not had any fevers or chills, no diarrhea, mild constipation with introduction of narcotic medications. No noted cough, dysuria.   Hospital Course by problem list: Principal Problem:   Hypernatremia Active  Problems:   Dementia   Acute renal injury   Encephalopathy   UTI (urinary tract infection)   Severe Dementia  The patient was admitted for hypernatremia and AMS due to poor oral intake of food. This appears to be secondary to advanced dementia. Her dementia is likely multifactorial. Patient continues to fail to eat, even when prompted by staff. Palliative care met with POA to discuss transition to hospice. I spoke with the POA yesterday after her meeting with Dr. Lovena Le. She explained that she would like to pursue hospice. See Dr. Tanna Furry note for additional details.   Anemia  Patient has anemia, last Hg 6.3. This is likely multifactorial. First patient received large IVFs during admission which likely caused some degree of dilution. Second, patient has had poor PO intake and the anemia is microcytic, making iron deficiency most likely. Certainly other nutritional deficiencies are possible (eg folate or B12 deficiency). Third, patient had postive FOBT on admission, likely representing a slow  GI bleed. There are no signs of acute hemorrhage. The patient is asymptomatic. Dr. Lovena Le spoke with POA later today about the risks and benefits of blood transfusion. The POA has elected for patient to not receive a blood transfusion as it is unlikely to alter the course of her disease.  Hypernatremia:  Likely due to decrease PO intake of food and water. Initially was corrected with D5W. Patient continues to not eat or drink. Throughout the admission her sodium was labile depending on fluids. She would become hypernatremic on saline and was corrected with D5W. However, with essentially no PO intake she would require g-tube feeding to maintain euvolemia and avoid electrolyte disturbance. Given the POA decision to move to hospice care, g-tube was refused. Per palliative note, no more lab draws and comfort feeds only.  Frequent falls, Left Tib/fib fracture:  Diagnosed with Tib/fib fracture in 03/05/13. Cast in  place. Appears stable during admission.  UTI  Patient had UTI. As patient is not tolerating PO, ceftriaxone IV was used. Culture was positive for E Coli > 100k that was pan-sensitive. Per palliative, IV ceftriaxone was continued until discharge.  AKI  Etiology was likely prerenal given profound dehydration. Resolved.   Discharge Vitals:   BP 97/58  Pulse 75  Temp(Src) 98 F (36.7 C) (Oral)  Resp 18  Ht 5' 4"  (1.626 m)  Wt 97 lb 10.6 oz (44.3 kg)  BMI 16.76 kg/m2  SpO2 99%  Discharge Labs:  No results found for this or any previous visit (from the past 24 hour(s)).  Signed: Marrion Coy, MD 03/25/2013, 10:30 AM   Time Spent on Discharge: 35 minutes Services Ordered on Discharge: None Equipment Ordered on Discharge: None

## 2013-03-25 NOTE — Progress Notes (Signed)
Speech Language Pathology Treatment: Dysphagia  Patient Details Name: [name] MRN: [id number] DOB: 09-Dec-1922 Today's Date: 03/25/2013 Time: 2300-9794 SLP Time Calculation (min): 8 min  Assessment / Plan / Recommendation Clinical Impression  Pt for likely D/C today to residential hospice.  She is alert, talking, and was willing to accept POs - tolerated sips of nectar-thick with excellent toleration despite mild oral holding.  Recommend careful hand-feeding with cues to chew/swallow; continue dysphagia 1/nectar as comfort POs given pt's good toleration of these consistencies.  No further SLP f/u warranted at this time.     HPI HPI: [name] is a 80 year old female with PMH of Dementia, Dysphasia, wheel chair dependent.  She was sent to the Emergency department today from her SNF due to altered mental status.  Per nursing home staff patient has had a decline in functional status since her Left Tib/fib fracture 12 days ago.  She has had decreased PO intake and become more lethargic (especially after introduction of oxycodone).  Today at lunchtime she became unresponsive, cold and clammy but still with pulse and respirations.  EMS responded and placed her on supplemental O2 and she became slightly more responsive.  In the ED she was found to be hypernatremic, and IMTS was asked to admit.      SLP Plan  All goals met    Recommendations Diet recommendations: Dysphagia 1 (puree);Nectar-thick liquid Liquids provided via: Cup Medication Administration: Crushed with puree Supervision: Full supervision/cueing for compensatory strategies;Staff to assist with self feeding Compensations: Slow rate;Small sips/bites;Check for pocketing Postural Changes and/or Swallow Maneuvers: Seated upright 90 degrees;Upright 30-60 min after meal              Plan: All goals met        Juan Quam Laurice 03/25/2013, 11:19 AM

## 2013-03-25 NOTE — Progress Notes (Signed)
Subjective:  NAEON. Patient continues to be alert but not able to communicate. POA spoke with Dr. Lovena Le yesterday and hospice care was selected.  Objective: Vital signs in last 24 hours: Filed Vitals:   03/24/13 0527 03/24/13 1328 03/24/13 2102 03/25/13 0351  BP: 105/48 94/42 104/59 97/58  Pulse: 101 93 89 75  Temp: 99.2 F (37.3 C) 98.5 F (36.9 C) 97.7 F (36.5 C) 98 F (36.7 C)  TempSrc: Oral Oral Oral Oral  Resp: 20 18 18 18   Height:      Weight:    97 lb 10.6 oz (44.3 kg)  SpO2: 94% 96% 97% 99%   Weight change: -7.1 oz (-0.2 kg)  Intake/Output Summary (Last 24 hours) at 03/25/13 5462 Last data filed at 03/25/13 0600  Gross per 24 hour  Intake   1925 ml  Output      5 ml  Net   1920 ml   Physical Exam  Constitutional:  Cachectic elderly female  Cardiovascular: Normal rate, regular rhythm, normal heart sounds and intact distal pulses.  Exam reveals no friction rub.   No murmur heard. Pulmonary/Chest: Effort normal and breath sounds normal. No respiratory distress. She has no wheezes. She has no rales.  Neurological: She is alert.  Not oriented. Minimal verbal response that is inappropriate and not goal directed.    Lab Results: Basic Metabolic Panel:  Recent Labs Lab 03/19/13 0105  03/20/13 0400  03/23/13 1830 03/24/13 0725  NA 162*  162*  < > 147  < > 152* 147  K 3.4*  3.3*  < > 4.1  < > 4.0 3.4*  CL 128*  128*  < > 113*  < > 120* 115*  CO2 20  21  < > 19  < > 15* 18*  GLUCOSE 148*  151*  < > 122*  < > 105* 130*  BUN 57*  57*  < > 23  < > 14 11  CREATININE 1.06  1.05  < > 0.85  < > 0.63 0.59  CALCIUM 8.5  8.6  < > 8.3*  < > 8.5 8.6  MG 2.9*  --  2.5  --   --   --   PHOS 1.8*  --  2.4  --   --   --   < > = values in this interval not displayed. Liver Function Tests:  Recent Labs Lab 03/19/13 0105 03/20/13 0400  ALBUMIN 2.4* 2.1*   CBC:  Recent Labs Lab 03/24/13 0310  WBC 24.0*  NEUTROABS 21.3*  HGB 6.3*  HCT 18.7*  MCV 79.2    PLT 373   CBG:  Recent Labs Lab 03/18/13 0745  GLUCAP 173*   Urine Drug Screen: Drugs of Abuse     Component Value Date/Time   LABOPIA NONE DETECTED 12/11/2006 0008   COCAINSCRNUR NONE DETECTED 12/11/2006 0008   LABBENZ NONE DETECTED 12/11/2006 0008   AMPHETMU NONE DETECTED 12/11/2006 0008   THCU NONE DETECTED 12/11/2006 0008   LABBARB  Value: NONE DETECTED        DRUG SCREEN FOR MEDICAL PURPOSES ONLY.  IF CONFIRMATION IS NEEDED FOR ANY PURPOSE, NOTIFY LAB WITHIN 5 DAYS. 12/11/2006 0008    Urinalysis:  Recent Labs Lab 03/21/13 0836  COLORURINE YELLOW  LABSPEC 1.014  PHURINE 5.5  GLUCOSEU NEGATIVE  HGBUR LARGE*  BILIRUBINUR NEGATIVE  KETONESUR NEGATIVE  PROTEINUR 30*  UROBILINOGEN 1.0  NITRITE POSITIVE*  LEUKOCYTESUR LARGE*    Micro Results: Recent Results (from the past 240  hour(s))  URINE CULTURE     Status: None   Collection Time    03/17/13  2:12 PM      Result Value Ref Range Status   Specimen Description URINE, CATHETERIZED   Final   Special Requests NONE   Final   Culture  Setup Time     Final   Value: 03/17/2013 14:37     Performed at [facility]     Final   Value: NO GROWTH     Performed at [facility]   Culture     Final   Value: NO GROWTH     Performed at [facility]   Report Status 03/18/2013 FINAL   Final  MRSA PCR SCREENING     Status: None   Collection Time    03/17/13  4:50 PM      Result Value Ref Range Status   MRSA by PCR NEGATIVE  NEGATIVE Final   Comment:            The GeneXpert MRSA Assay (FDA     approved for NASAL specimens     only), is one component of a     comprehensive MRSA colonization     surveillance program. It is not     intended to diagnose MRSA     infection nor to guide or     monitor treatment for     MRSA infections.  URINE CULTURE     Status: None   Collection Time    03/21/13  8:36 AM      Result Value Ref Range Status   Specimen Description URINE, RANDOM    Final   Special Requests Normal   Final   Culture  Setup Time     Final   Value: 03/21/2013 13:50     Performed at [facility]     Final   Value: >=100,000 COLONIES/ML     Performed at [facility]   Culture     Final   Value: ESCHERICHIA COLI     Performed at [facility]   Report Status 03/23/2013 FINAL   Final   Organism ID, Bacteria ESCHERICHIA COLI   Final   Studies/Results: No results found. Medications: I have reviewed the patient's current medications. Scheduled Meds: . antiseptic oral rinse  15 mL Mouth Rinse q12n4p  . cefTRIAXone (ROCEPHIN)  IV  1 g Intravenous Q24H  . chlorhexidine  15 mL Mouth Rinse BID  . feeding supplement (ENSURE COMPLETE)  237 mL Oral BID BM  . latanoprost  1 drop Left Eye QHS  . sodium chloride  3 mL Intravenous Q12H   Continuous Infusions: . dextrose 75 mL/hr at 03/24/13 1829   PRN Meds:.acetaminophen, alum & mag hydroxide-simeth, magnesium hydroxide, RESOURCE THICKENUP CLEAR Assessment/Plan: Principal Problem:   Hypernatremia Active Problems:   Dementia   Acute renal injury   Encephalopathy   UTI (urinary tract infection)  Severe Dementia  Patient continues to fail to eat, even when prompted by staff. Palliative care met with POA to discuss transition to hospice. I spoke with the POA yesterday after her meeting with Dr. Lovena Le. She explained that she would like to pursue hospice. See Dr. Tanna Furry note for additional details.   Anemia  Patient has anemia due to multifactorial causes. She is asymptomatic at this time. Palliative care spoke with POA who elected to pursue hospice care and elected to not transfuse patient. Further,  POA elected for no more blood draws.  Hypernatremia:  Likely due to decrease PO intake of food and water. As the family elected for no more blood draws per palliative care's note, it will be difficult to titrate fluids to compensate for patients intermittent hypernatremia.  However, the primary problem causing the patients hypernatremia is her inability to eat or drink. Thus, IVFs are only a temporary fix for this problem.  -Decreas D5W to 50 cc/hr   Frequent falls, Left Tib/fib fracture:  Stable. Continue pain managment with tylenol.  UTI  Improving. - Continue IV ceftriaxone until discharge  AKI  Etiology was likely prerenal given profound dehydration. Resolved.  Dispo: Anticipate dischrge to [facility] today.  The patient does have a current PCP (No Pcp Per Patient) and does not need an [facility] hospital follow-up appointment after discharge.  The patient does have transportation limitations that hinder transportation to clinic appointments.  .Services Needed at time of discharge: Y = Yes, Blank = No PT:   OT:   RN:   Equipment:   Other:     LOS: 8 days   Marrion Coy, MD 03/25/2013, 7:23 AM

## 2013-03-25 NOTE — Progress Notes (Signed)
DC pt per MD orders; Discharge instructions given to PTAR; hospice facility called and received report.  Park BreedBARNETT, Kemesha Mosey M, RN

## 2013-03-25 NOTE — Consult Note (Signed)
[facility] Liaison: Received request from CSW for [facility]. Chart reviewed. [facility] at [facility]. Made CSW aware and will follow for updates until disposition determined. Thank you. Forrestine Himva Kadin Bera LCSW [phone]

## 2013-03-25 NOTE — Care Management Note (Signed)
    Page 1 of 1   03/25/2013     4:22:15 PM   CARE MANAGEMENT NOTE 03/25/2013  Patient:  [name],[name]   Account Number:  [id number]  Date Initiated:  03/18/2013  Documentation initiated by:  Donn PieriniWEBSTER,KRISTI  Subjective/Objective Assessment:   Pt admitted with hypernatremia and AMS     Action/Plan:   [facility]- CSW consulted for placement needs   Anticipated DC Date:  03/25/2013   [facility]  In-house referral  Clinical Social Worker      DC Planning Services  CM consult      Choice offered to / List presented to:             Status of service:  Completed, signed off Medicare Important Message given?   (If response is "NO", the following Medicare IM given date fields will be blank) Date Medicare IM given:   Date Additional Medicare IM given:    Discharge Disposition:  HOSPICE MEDICAL FACILITY  Per UR Regulation:  Reviewed for med. necessity/level of care/duration of stay  If discussed at Long Length of Stay Meetings, dates discussed:   03/24/2013    Comments:  03/25/13 Shaylon Aden,RN,BSN [phone] PT DISCHARGING TO [facility] TODAY, PER CSW ARRANGEMENTS, AND FAMILY PREFERENCE.  03/24/13 Lelend Heinecke,RN,BSN [phone] PALLIATIVE MEETING WITH FAMILY TODAY TO DISCUSS GOALS OF CARE.

## 2013-03-25 NOTE — Plan of Care (Signed)
Problem: Acute Rehab PT Goals(only PT should resolve) Goal: Pt will Roll Supine to Side Outcome: Not Met (add Reason) PT d/c by MD secondary to decline in status and hospice. Goal: Pt Will Go Supine/Side To Sit Outcome: Not Met (add Reason) PT d/c by MD secondary to decline in status and hospice. Goal: Pt Will Go Sit To Supine/Side Outcome: Not Met (add Reason) PT d/c by MD secondary to decline in status and hospice. Goal: Patient Will Perform Sitting Balance Outcome: Not Met (add Reason) PT d/c by MD secondary to decline in status and hospice.

## 2013-04-20 DEATH — deceased

## 2015-06-12 IMAGING — CT CT HEAD W/O CM
2 series · 15 of 30 positions shown, 19 images · non-contrast
Comparison: CT scan dated 12/11/2006

CLINICAL DATA: Headache.  Vertigo.

CT HEAD WITHOUT CONTRAST
TECHNIQUE: Contiguous axial images were obtained from the base of
the skull through the vertex without contrast.

[Series 2: head w/o · axial · non-contrast · 0.48mm/px · z∈[-356,-231]mm · 13 of 31 slices shown, 17 images]
[im 3/31  brain]
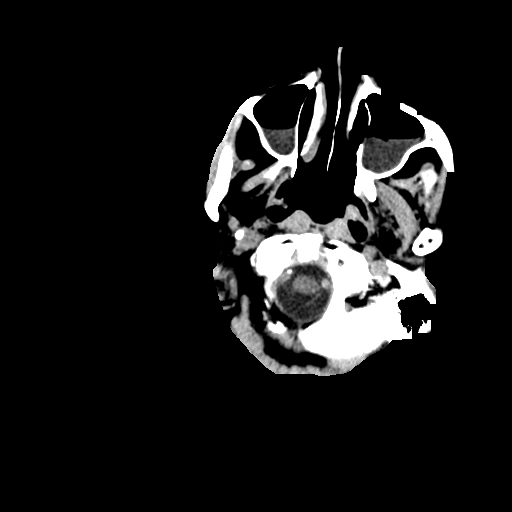
[im 3/31  bone]
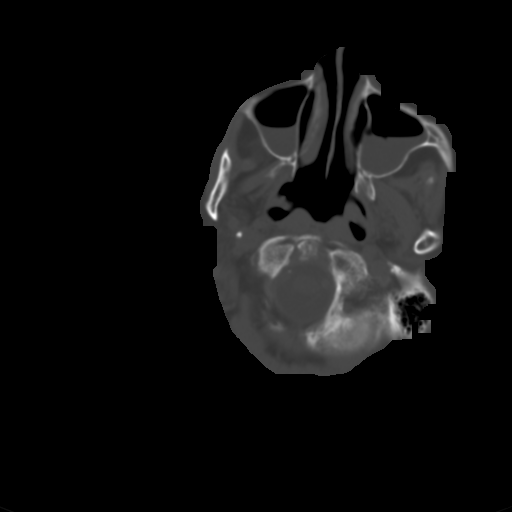
[im 5/31  brain]
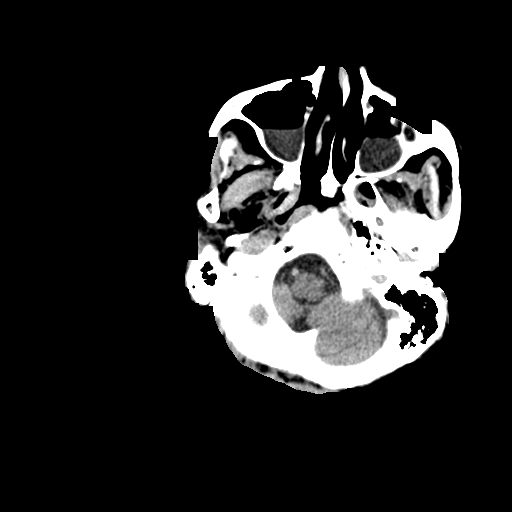
[im 7/31  brain]
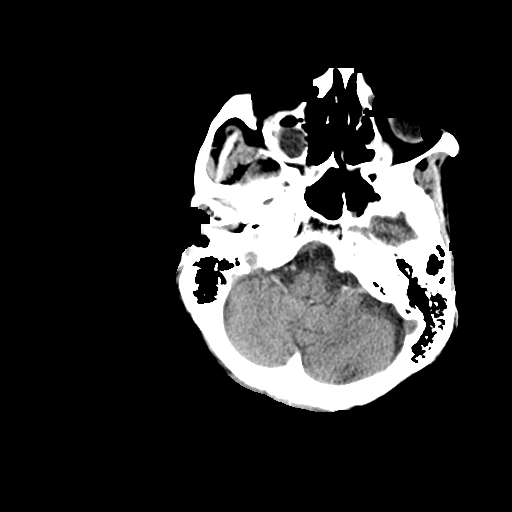
[im 9/31  brain]
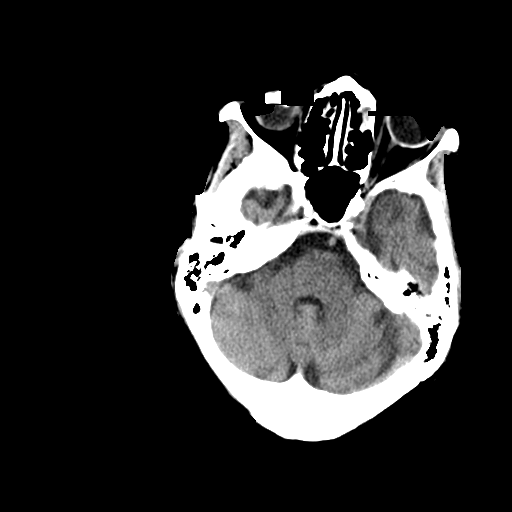
[im 11/31  brain]
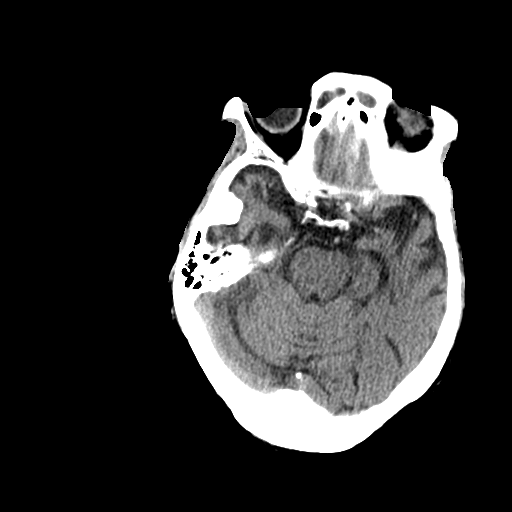
[im 11/31  bone]
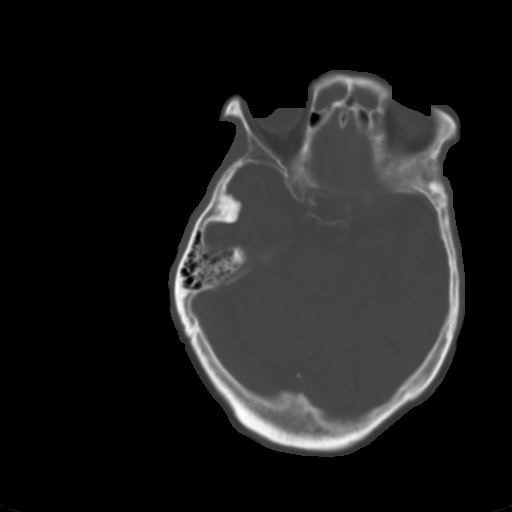
[im 13/31  brain]
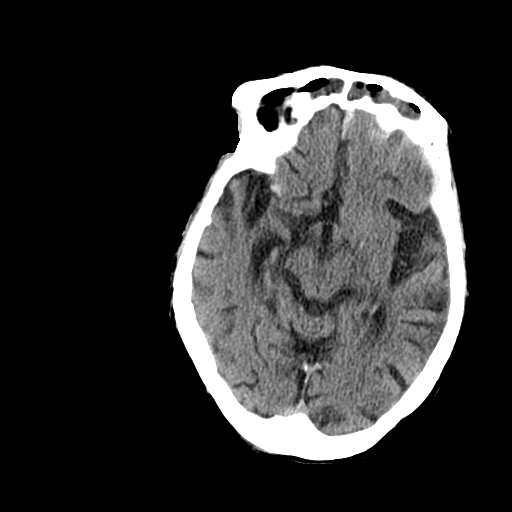
[im 16/31  brain]
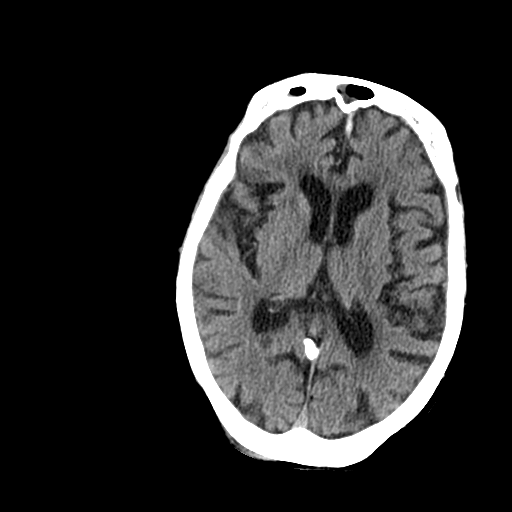
[im 18/31  brain]
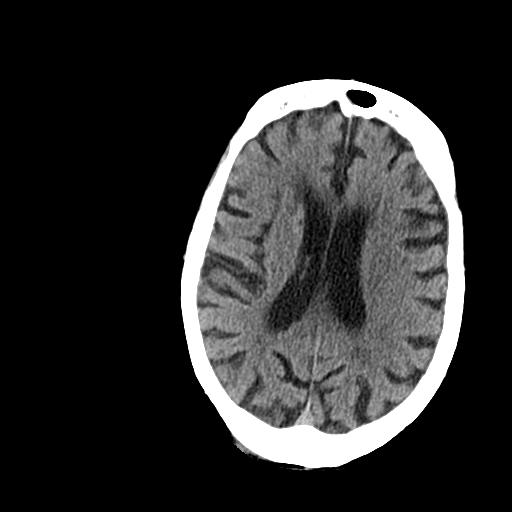
[im 20/31  brain]
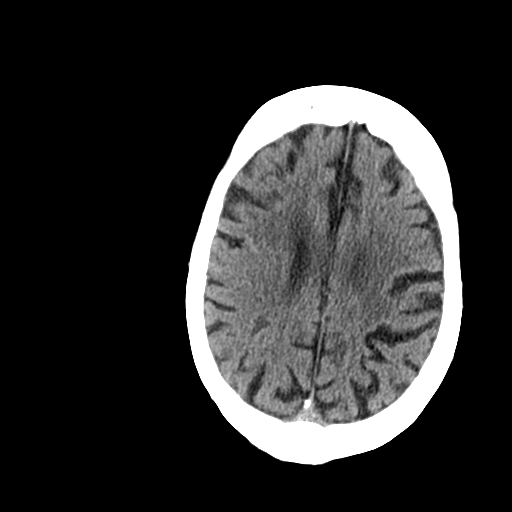
[im 20/31  bone]
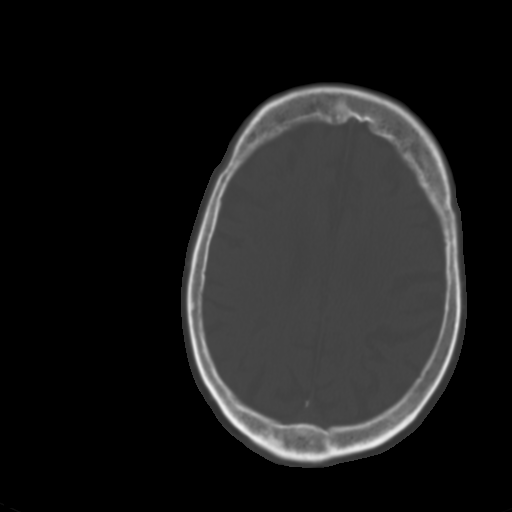
[im 22/31  brain]
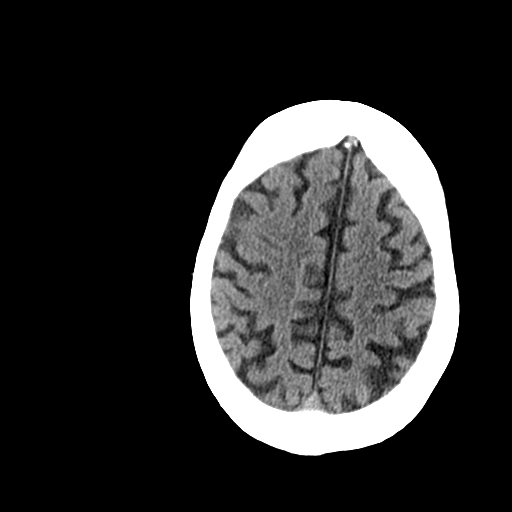
[im 24/31  brain]
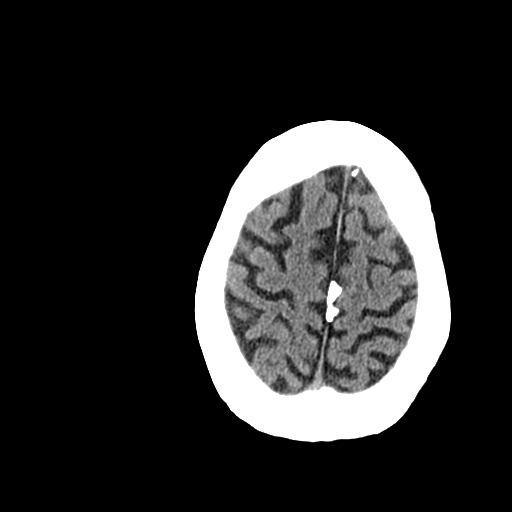
[im 26/31  brain]
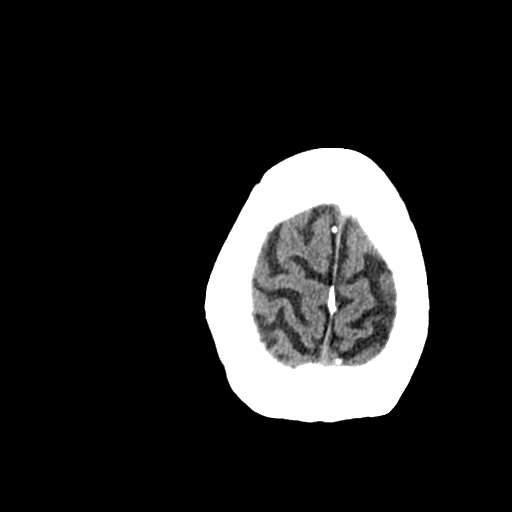
[im 28/31  brain]
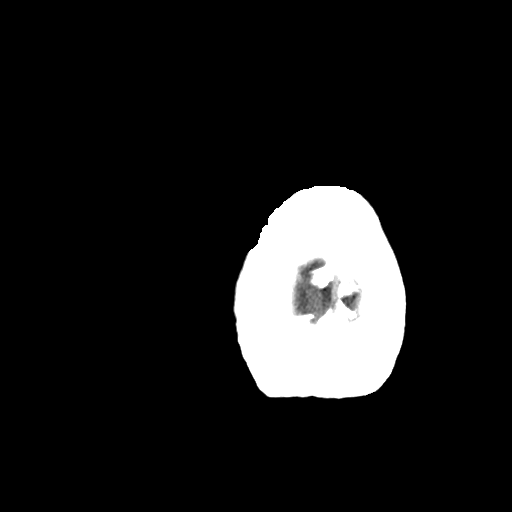
[im 28/31  bone]
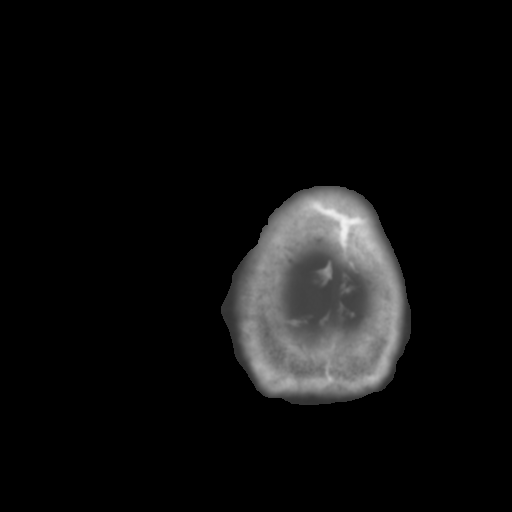

[Series 3: bone windows · axial · 0.48mm/px · z∈[-356,-336]mm · 2 of 31 slices shown]
[im 3/31  bone]
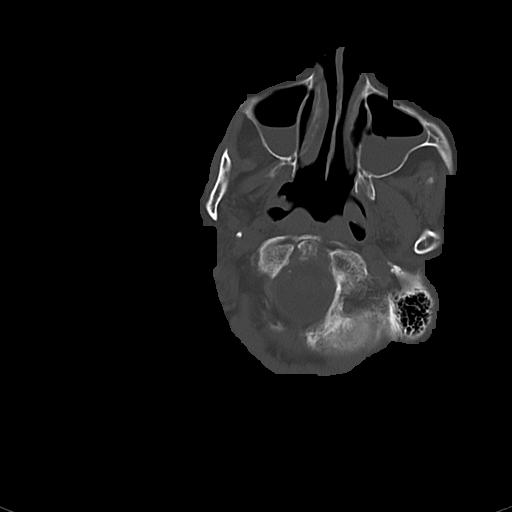
[im 7/31  bone]
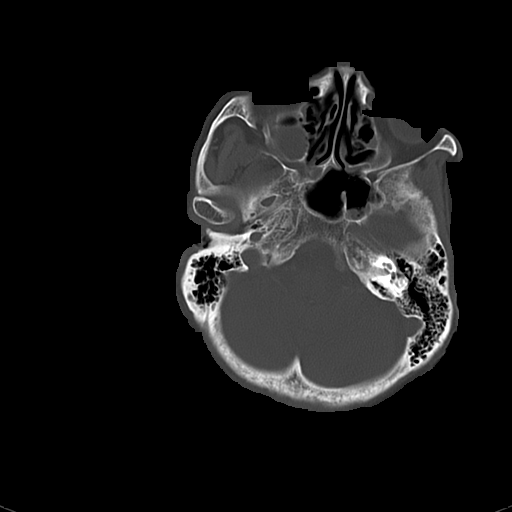

[15 of 30 positions shown; findings below may reference images not displayed]

FINDINGS: There is a small hemorrhagic contusion of the right
frontal lobe adjacent to the sylvian fissure visible on images 13
and 14 of series 2.  There is also a small subdural hematoma along
the anterior aspect of the interhemispheric falx as well as
inferior to the frontal lobe in the midline.  There is a small
amount of subarachnoid hemorrhage in the left subfrontal region as
well as a tiny left subdural hematoma over the left frontal lobe
seen on image number 13 of series 2.

There is diffuse chronic cerebral cortical and cerebellar atrophy.
The ventricles are not dilated.

The patient does have air fluid levels in both maxillary sinuses
with extensive partial opacification of the frontal sinus
consistent with acute sinusitis.

There is extensive cerumen and both external auditory canals.
Mastoid air cells are clear.  No skull fractures.

Chronic calcification of a portion of the lobe of the right eye.
IMPRESSION: The patient has acute intracranial hemorrhages
including subarachnoid, subdural, and parenchymal, consistent with
a fall.  There is no mass effect or midline shift.  The hemorrhages
are small.

Probable acute maxillary and frontal sinusitis.

## 2015-11-11 IMAGING — CR DG FEMUR 2+V*R*
2 series · 2 of 2 positions shown · non-contrast
Comparison: None.

CLINICAL DATA: Fall.  Right femur pain.  Right lower extremity NGT.

EXAM:
RIGHT FEMUR - 2 VIEW

[x femur proximal ap right (1 of 2)]
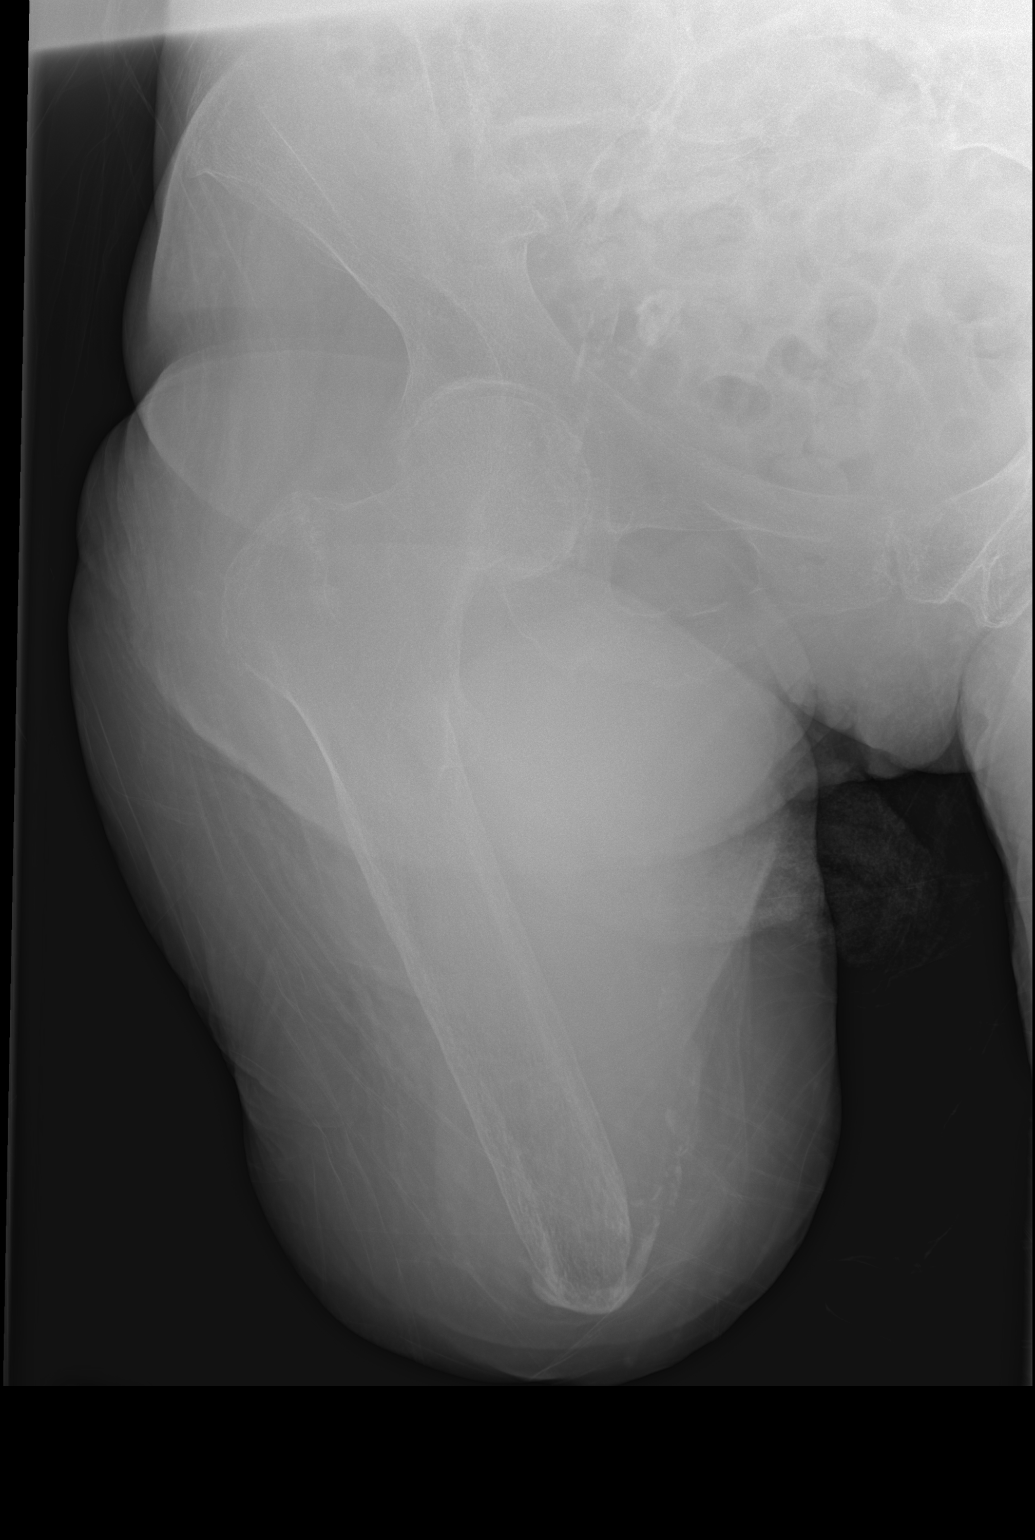

[x femur proximal ap right (2 of 2)]
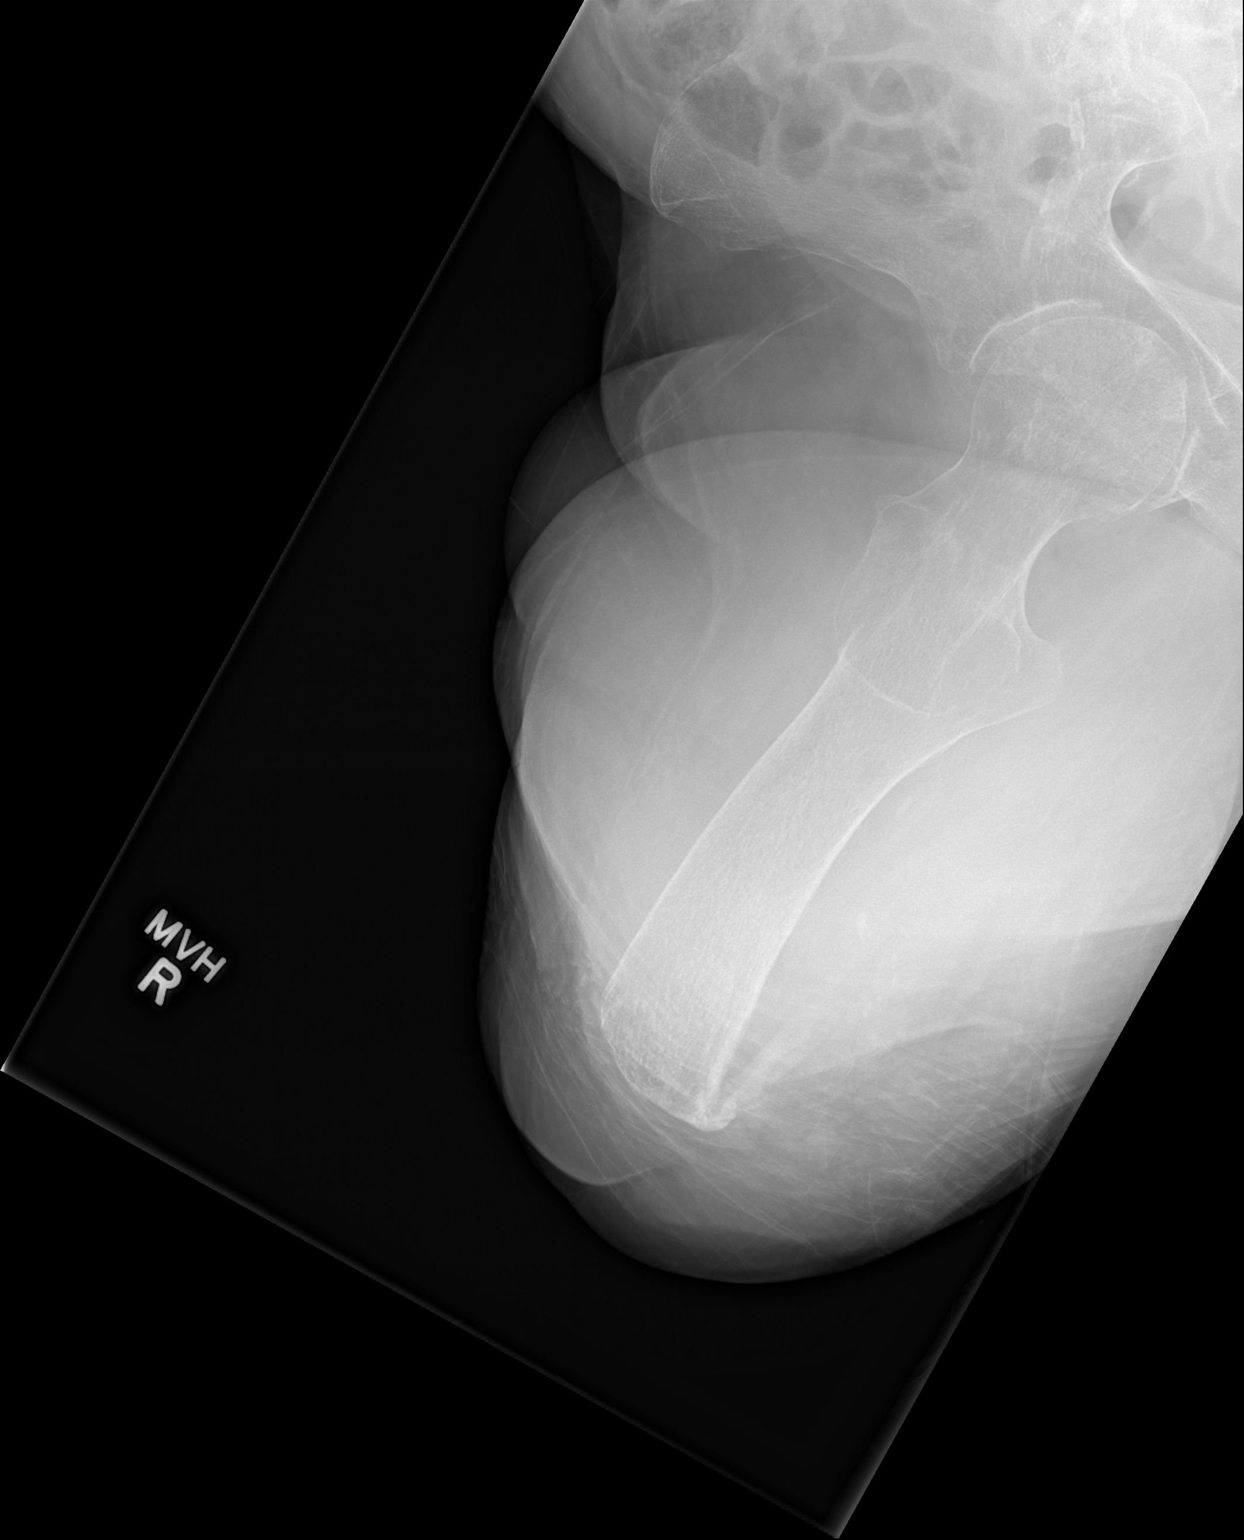

[2 of 2 positions shown; findings below may reference images not displayed]

FINDINGS: Amputation seen at the proximal to mid femoral diaphysis. There is
no evidence of fracture or other focal bone lesions. No evidence of
osteolysis or acute periostitis. Generalized osteopenia noted. Mild
right hip osteoarthritis also noted.
IMPRESSION: No radiographic evidence of fracture or other acute findings.
